# Patient Record
Sex: Female | Born: 1970
Health system: Southern US, Community
[De-identification: ages and names within clinical notes are randomized; demographics above are authoritative.]

## PROBLEM LIST (undated history)

## (undated) DIAGNOSIS — N840 Polyp of corpus uteri: Secondary | ICD-10-CM

## (undated) DIAGNOSIS — T7840XA Allergy, unspecified, initial encounter: Secondary | ICD-10-CM

## (undated) DIAGNOSIS — K219 Gastro-esophageal reflux disease without esophagitis: Secondary | ICD-10-CM

## (undated) DIAGNOSIS — D649 Anemia, unspecified: Secondary | ICD-10-CM

## (undated) HISTORY — DX: Gastro-esophageal reflux disease without esophagitis: K21.9

## (undated) HISTORY — DX: Polyp of corpus uteri: N84.0

## (undated) HISTORY — DX: Anemia, unspecified: D64.9

## (undated) HISTORY — PX: UPPER GASTROINTESTINAL ENDOSCOPY: SHX188

## (undated) HISTORY — PX: COLONOSCOPY: SHX174

## (undated) HISTORY — PX: TONSILLECTOMY: SUR1361

## (undated) HISTORY — DX: Allergy, unspecified, initial encounter: T78.40XA

## (undated) HISTORY — PX: DILATATION AND CURETTAGE/HYSTEROSCOPY WITH MINERVA: SHX6851

---

## 2009-07-22 ENCOUNTER — Emergency Department (HOSPITAL_COMMUNITY): Admission: EM | Admit: 2009-07-22 | Discharge: 2009-07-22 | Payer: Self-pay | Admitting: Emergency Medicine

## 2011-09-10 ENCOUNTER — Other Ambulatory Visit: Payer: Self-pay | Admitting: Obstetrics and Gynecology

## 2011-09-10 DIAGNOSIS — R928 Other abnormal and inconclusive findings on diagnostic imaging of breast: Secondary | ICD-10-CM

## 2011-09-17 ENCOUNTER — Ambulatory Visit
Admission: RE | Admit: 2011-09-17 | Discharge: 2011-09-17 | Disposition: A | Discharge status: Home / Self Care | Payer: Commercial Managed Care - PPO | Source: Ambulatory Visit | Attending: Obstetrics and Gynecology | Admitting: Obstetrics and Gynecology

## 2011-09-17 ENCOUNTER — Other Ambulatory Visit: Payer: Self-pay | Admitting: Obstetrics and Gynecology

## 2011-09-17 DIAGNOSIS — R928 Other abnormal and inconclusive findings on diagnostic imaging of breast: Secondary | ICD-10-CM

## 2014-03-14 ENCOUNTER — Other Ambulatory Visit: Payer: Self-pay | Admitting: Obstetrics and Gynecology

## 2014-03-14 DIAGNOSIS — N63 Unspecified lump in unspecified breast: Secondary | ICD-10-CM

## 2014-03-22 ENCOUNTER — Ambulatory Visit
Admission: RE | Admit: 2014-03-22 | Discharge: 2014-03-22 | Disposition: A | Discharge status: Home / Self Care | Payer: BC Managed Care – PPO | Source: Ambulatory Visit | Attending: Obstetrics and Gynecology | Admitting: Obstetrics and Gynecology

## 2014-03-22 ENCOUNTER — Encounter (INDEPENDENT_AMBULATORY_CARE_PROVIDER_SITE_OTHER): Payer: Self-pay

## 2014-03-22 DIAGNOSIS — N63 Unspecified lump in unspecified breast: Secondary | ICD-10-CM

## 2016-04-08 ENCOUNTER — Other Ambulatory Visit: Payer: Self-pay | Admitting: Obstetrics and Gynecology

## 2016-04-08 DIAGNOSIS — Z1231 Encounter for screening mammogram for malignant neoplasm of breast: Secondary | ICD-10-CM

## 2016-04-22 ENCOUNTER — Ambulatory Visit
Admission: RE | Admit: 2016-04-22 | Discharge: 2016-04-22 | Disposition: A | Discharge status: Home / Self Care | Payer: BC Managed Care – PPO | Source: Ambulatory Visit | Attending: Obstetrics and Gynecology | Admitting: Obstetrics and Gynecology

## 2016-04-22 DIAGNOSIS — Z1231 Encounter for screening mammogram for malignant neoplasm of breast: Secondary | ICD-10-CM

## 2018-05-16 ENCOUNTER — Other Ambulatory Visit: Payer: Self-pay | Admitting: Obstetrics and Gynecology

## 2018-05-16 DIAGNOSIS — Z1231 Encounter for screening mammogram for malignant neoplasm of breast: Secondary | ICD-10-CM

## 2018-06-09 ENCOUNTER — Ambulatory Visit
Admission: RE | Admit: 2018-06-09 | Discharge: 2018-06-09 | Disposition: A | Discharge status: Home / Self Care | Payer: BC Managed Care – PPO | Source: Ambulatory Visit | Attending: Obstetrics and Gynecology | Admitting: Obstetrics and Gynecology

## 2018-06-09 DIAGNOSIS — Z1231 Encounter for screening mammogram for malignant neoplasm of breast: Secondary | ICD-10-CM

## 2018-06-12 ENCOUNTER — Other Ambulatory Visit: Payer: Self-pay | Admitting: Obstetrics and Gynecology

## 2018-06-12 DIAGNOSIS — R928 Other abnormal and inconclusive findings on diagnostic imaging of breast: Secondary | ICD-10-CM

## 2018-06-14 ENCOUNTER — Ambulatory Visit
Admission: RE | Admit: 2018-06-14 | Discharge: 2018-06-14 | Disposition: A | Discharge status: Home / Self Care | Payer: BC Managed Care – PPO | Source: Ambulatory Visit | Attending: Obstetrics and Gynecology | Admitting: Obstetrics and Gynecology

## 2018-06-14 ENCOUNTER — Other Ambulatory Visit: Payer: Self-pay | Admitting: Obstetrics and Gynecology

## 2018-06-14 DIAGNOSIS — N6320 Unspecified lump in the left breast, unspecified quadrant: Secondary | ICD-10-CM

## 2018-06-14 DIAGNOSIS — R928 Other abnormal and inconclusive findings on diagnostic imaging of breast: Secondary | ICD-10-CM

## 2018-06-21 ENCOUNTER — Other Ambulatory Visit: Payer: BC Managed Care – PPO

## 2018-06-22 ENCOUNTER — Ambulatory Visit
Admission: RE | Admit: 2018-06-22 | Discharge: 2018-06-22 | Disposition: A | Discharge status: Home / Self Care | Payer: BC Managed Care – PPO | Source: Ambulatory Visit | Attending: Obstetrics and Gynecology | Admitting: Obstetrics and Gynecology

## 2018-06-22 ENCOUNTER — Other Ambulatory Visit: Payer: Self-pay | Admitting: Obstetrics and Gynecology

## 2018-06-22 DIAGNOSIS — N6320 Unspecified lump in the left breast, unspecified quadrant: Secondary | ICD-10-CM

## 2019-01-23 DIAGNOSIS — Z01419 Encounter for gynecological examination (general) (routine) without abnormal findings: Secondary | ICD-10-CM | POA: Diagnosis not present

## 2019-01-23 DIAGNOSIS — Z6831 Body mass index (BMI) 31.0-31.9, adult: Secondary | ICD-10-CM | POA: Diagnosis not present

## 2019-01-23 DIAGNOSIS — N939 Abnormal uterine and vaginal bleeding, unspecified: Secondary | ICD-10-CM | POA: Diagnosis not present

## 2019-06-21 ENCOUNTER — Other Ambulatory Visit: Payer: Self-pay | Admitting: Obstetrics and Gynecology

## 2019-06-21 DIAGNOSIS — Z1231 Encounter for screening mammogram for malignant neoplasm of breast: Secondary | ICD-10-CM

## 2019-08-10 ENCOUNTER — Other Ambulatory Visit: Payer: Self-pay

## 2019-08-10 ENCOUNTER — Ambulatory Visit
Admission: RE | Admit: 2019-08-10 | Discharge: 2019-08-10 | Disposition: A | Discharge status: Home / Self Care | Payer: 59 | Source: Ambulatory Visit | Attending: Obstetrics and Gynecology | Admitting: Obstetrics and Gynecology

## 2019-08-10 DIAGNOSIS — Z1231 Encounter for screening mammogram for malignant neoplasm of breast: Secondary | ICD-10-CM | POA: Diagnosis not present

## 2019-08-29 DIAGNOSIS — H5202 Hypermetropia, left eye: Secondary | ICD-10-CM | POA: Diagnosis not present

## 2019-08-29 DIAGNOSIS — H52222 Regular astigmatism, left eye: Secondary | ICD-10-CM | POA: Diagnosis not present

## 2019-08-29 DIAGNOSIS — H524 Presbyopia: Secondary | ICD-10-CM | POA: Diagnosis not present

## 2019-09-20 DIAGNOSIS — L821 Other seborrheic keratosis: Secondary | ICD-10-CM | POA: Diagnosis not present

## 2019-09-20 DIAGNOSIS — L812 Freckles: Secondary | ICD-10-CM | POA: Diagnosis not present

## 2019-09-20 DIAGNOSIS — D2262 Melanocytic nevi of left upper limb, including shoulder: Secondary | ICD-10-CM | POA: Diagnosis not present

## 2019-09-20 DIAGNOSIS — D2261 Melanocytic nevi of right upper limb, including shoulder: Secondary | ICD-10-CM | POA: Diagnosis not present

## 2019-09-20 DIAGNOSIS — D2272 Melanocytic nevi of left lower limb, including hip: Secondary | ICD-10-CM | POA: Diagnosis not present

## 2019-09-20 DIAGNOSIS — D225 Melanocytic nevi of trunk: Secondary | ICD-10-CM | POA: Diagnosis not present

## 2019-09-20 DIAGNOSIS — D2271 Melanocytic nevi of right lower limb, including hip: Secondary | ICD-10-CM | POA: Diagnosis not present

## 2019-09-20 DIAGNOSIS — D2372 Other benign neoplasm of skin of left lower limb, including hip: Secondary | ICD-10-CM | POA: Diagnosis not present

## 2019-09-20 DIAGNOSIS — D1801 Hemangioma of skin and subcutaneous tissue: Secondary | ICD-10-CM | POA: Diagnosis not present

## 2020-03-06 DIAGNOSIS — N912 Amenorrhea, unspecified: Secondary | ICD-10-CM | POA: Diagnosis not present

## 2020-03-06 DIAGNOSIS — Z6830 Body mass index (BMI) 30.0-30.9, adult: Secondary | ICD-10-CM | POA: Diagnosis not present

## 2020-03-06 DIAGNOSIS — Z01419 Encounter for gynecological examination (general) (routine) without abnormal findings: Secondary | ICD-10-CM | POA: Diagnosis not present

## 2020-03-06 DIAGNOSIS — N951 Menopausal and female climacteric states: Secondary | ICD-10-CM | POA: Diagnosis not present

## 2020-05-09 ENCOUNTER — Other Ambulatory Visit (HOSPITAL_COMMUNITY): Payer: Self-pay | Admitting: Obstetrics and Gynecology

## 2020-05-09 MED FILL — LO LOESTRIN FE 1-10 TABLET: 1 MG-10 MCG | 28 days supply | Qty: 28 | Fill #0

## 2020-05-15 IMAGING — MG MM CLIP PLACEMENT
2 series · 2 of 2 positions shown · non-contrast
Comparison: Previous exam(s).

CLINICAL DATA: Status post ultrasound-guided biopsy of a LEFT
breast mass at the 3 o'clock axis.

EXAM:
DIAGNOSTIC LEFT MAMMOGRAM POST ULTRASOUND BIOPSY

[L ML]
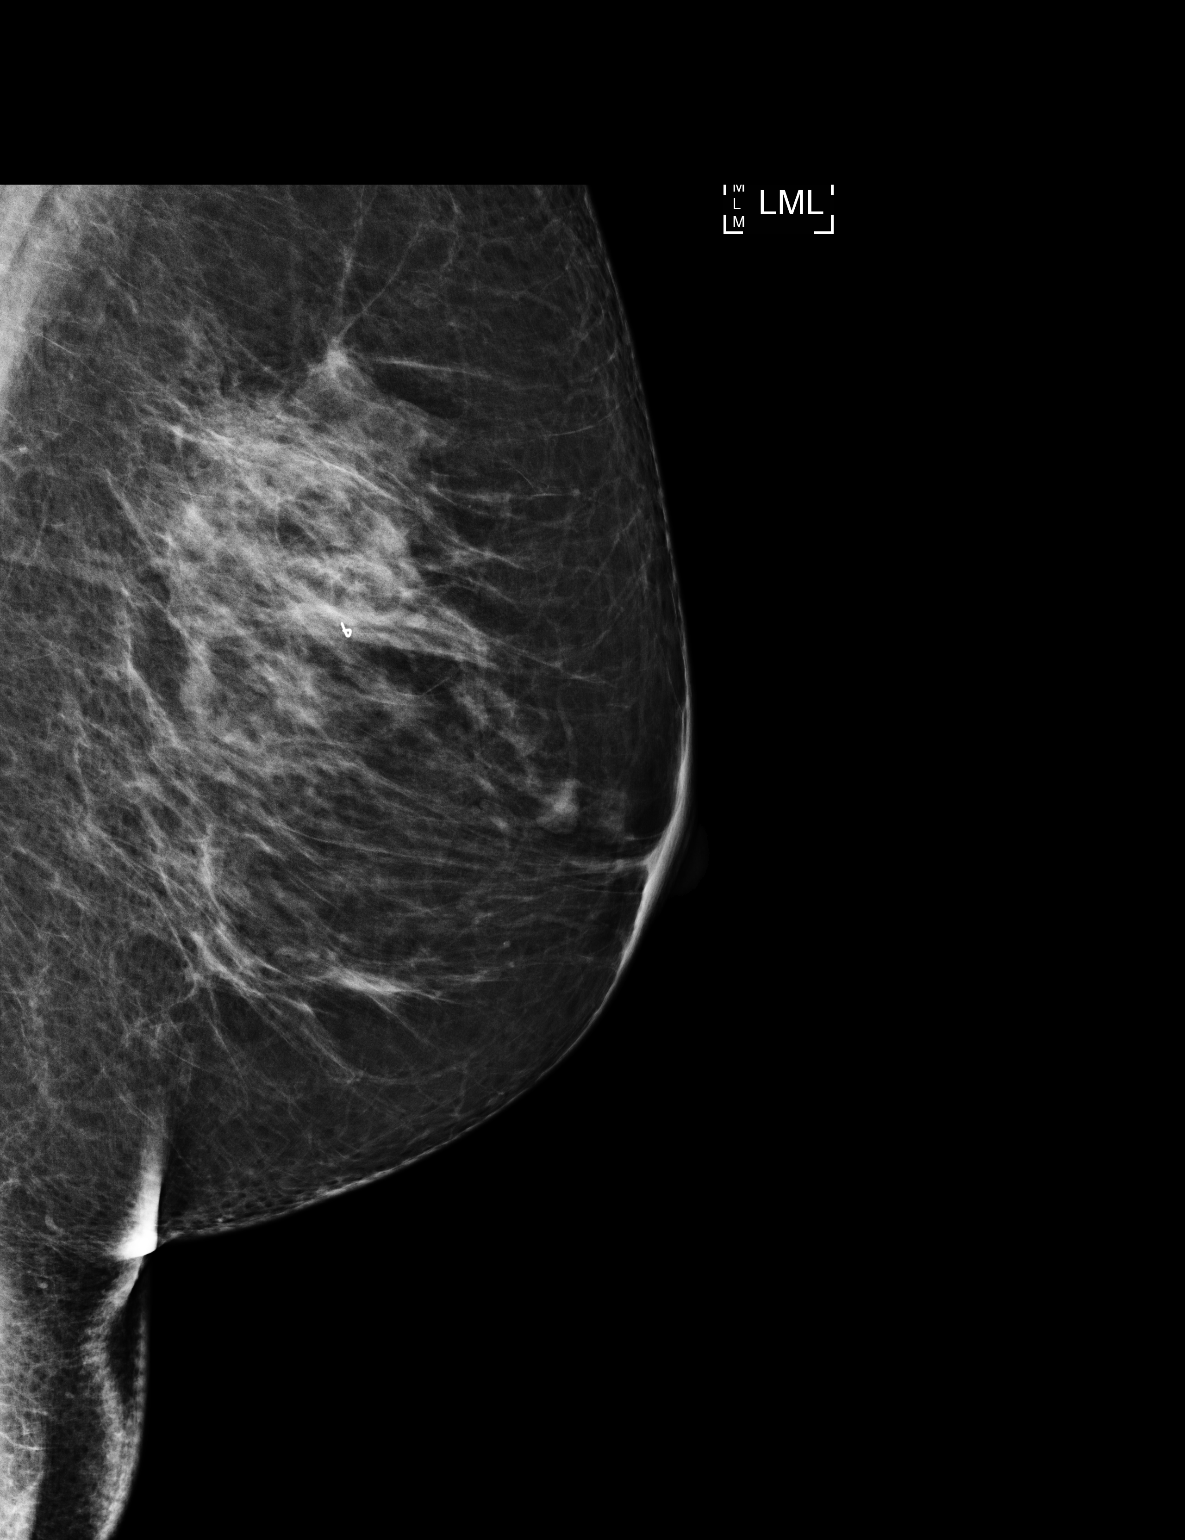

[L CC]
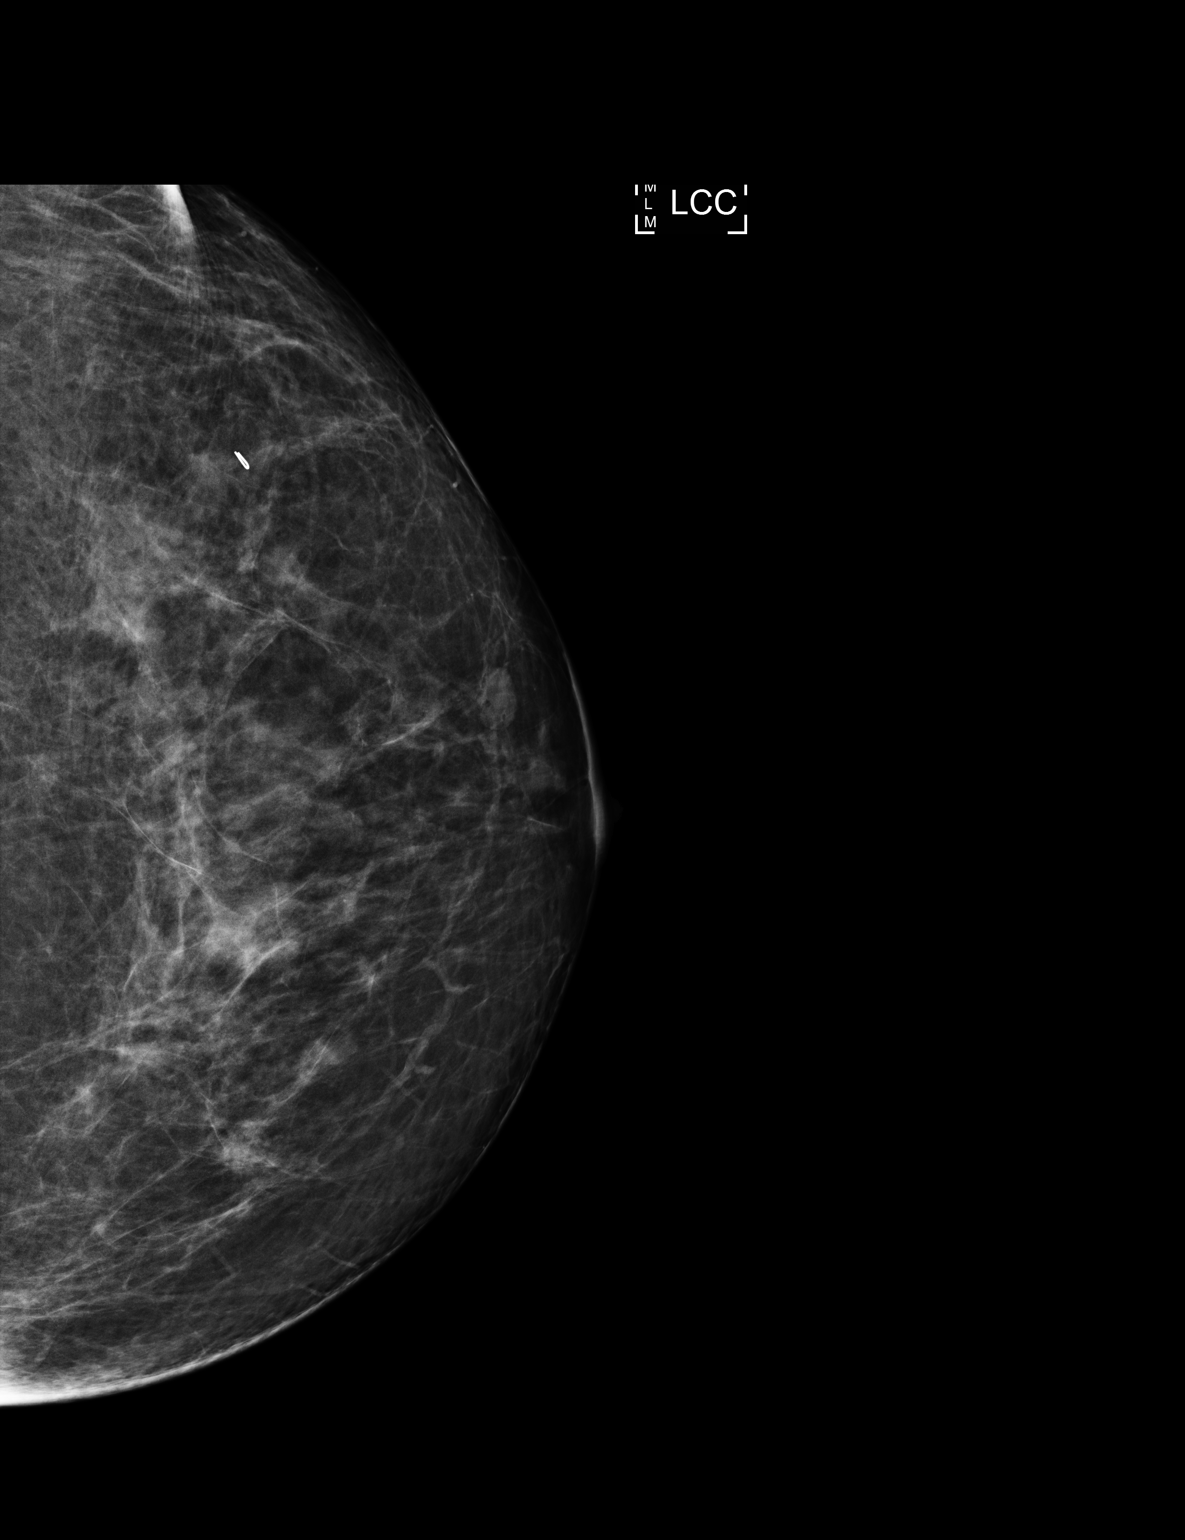

[2 of 2 positions shown; findings below may reference images not displayed]

FINDINGS: Mammographic images were obtained following ultrasound guided biopsy
of the LEFT breast mass at the 3 o'clock axis. A ribbon shaped clip
was placed at the conclusion of the procedure. Clip is
well-positioned at the site of the targeted mass.
IMPRESSION: Ribbon shaped clip is well-positioned at the site of the targeted
mass within the LEFT breast at the 3 o'clock axis.

Final Assessment: Post Procedure Mammograms for Marker Placement

## 2020-06-06 MED FILL — LO LOESTRIN FE 1-10 TABLET: 1 MG-10 MCG | 28 days supply | Qty: 28 | Fill #1

## 2020-07-07 MED FILL — LO LOESTRIN FE 1-10 TABLET: 1 MG-10 MCG | 28 days supply | Qty: 28 | Fill #2

## 2020-09-09 DIAGNOSIS — Z20828 Contact with and (suspected) exposure to other viral communicable diseases: Secondary | ICD-10-CM | POA: Diagnosis not present

## 2020-09-16 ENCOUNTER — Other Ambulatory Visit: Payer: 59

## 2020-09-16 DIAGNOSIS — Z9189 Other specified personal risk factors, not elsewhere classified: Secondary | ICD-10-CM | POA: Diagnosis not present

## 2020-09-18 DIAGNOSIS — Z1152 Encounter for screening for COVID-19: Secondary | ICD-10-CM | POA: Diagnosis not present

## 2020-09-25 DIAGNOSIS — D1801 Hemangioma of skin and subcutaneous tissue: Secondary | ICD-10-CM | POA: Diagnosis not present

## 2020-09-25 DIAGNOSIS — L738 Other specified follicular disorders: Secondary | ICD-10-CM | POA: Diagnosis not present

## 2020-09-25 DIAGNOSIS — D2261 Melanocytic nevi of right upper limb, including shoulder: Secondary | ICD-10-CM | POA: Diagnosis not present

## 2020-09-25 DIAGNOSIS — L821 Other seborrheic keratosis: Secondary | ICD-10-CM | POA: Diagnosis not present

## 2020-09-25 DIAGNOSIS — D2361 Other benign neoplasm of skin of right upper limb, including shoulder: Secondary | ICD-10-CM | POA: Diagnosis not present

## 2020-09-25 DIAGNOSIS — D235 Other benign neoplasm of skin of trunk: Secondary | ICD-10-CM | POA: Diagnosis not present

## 2020-09-25 DIAGNOSIS — D485 Neoplasm of uncertain behavior of skin: Secondary | ICD-10-CM | POA: Diagnosis not present

## 2020-09-25 DIAGNOSIS — D2262 Melanocytic nevi of left upper limb, including shoulder: Secondary | ICD-10-CM | POA: Diagnosis not present

## 2020-09-25 DIAGNOSIS — C44519 Basal cell carcinoma of skin of other part of trunk: Secondary | ICD-10-CM | POA: Diagnosis not present

## 2020-09-29 ENCOUNTER — Encounter: Payer: Self-pay | Admitting: Family Medicine

## 2020-09-29 ENCOUNTER — Encounter: Payer: Self-pay | Admitting: Physical Therapy

## 2020-09-29 ENCOUNTER — Ambulatory Visit (INDEPENDENT_AMBULATORY_CARE_PROVIDER_SITE_OTHER): Payer: 59 | Admitting: Family Medicine

## 2020-09-29 ENCOUNTER — Ambulatory Visit (INDEPENDENT_AMBULATORY_CARE_PROVIDER_SITE_OTHER): Payer: 59 | Admitting: Physical Therapy

## 2020-09-29 ENCOUNTER — Other Ambulatory Visit: Payer: Self-pay | Admitting: Family Medicine

## 2020-09-29 ENCOUNTER — Other Ambulatory Visit: Payer: Self-pay

## 2020-09-29 DIAGNOSIS — M545 Low back pain, unspecified: Secondary | ICD-10-CM | POA: Diagnosis not present

## 2020-09-29 DIAGNOSIS — M5442 Lumbago with sciatica, left side: Secondary | ICD-10-CM | POA: Diagnosis not present

## 2020-09-29 DIAGNOSIS — M6281 Muscle weakness (generalized): Secondary | ICD-10-CM

## 2020-09-29 DIAGNOSIS — R262 Difficulty in walking, not elsewhere classified: Secondary | ICD-10-CM | POA: Diagnosis not present

## 2020-09-29 MED ORDER — PREDNISONE 10 MG PO TABS: ORAL_TABLET | ORAL | 0 refills | Status: DC

## 2020-09-29 MED ORDER — BACLOFEN 10 MG PO TABS: 5.0000 mg | ORAL_TABLET | Freq: Three times a day (TID) | ORAL | 3 refills | Status: DC | PRN

## 2020-09-29 MED FILL — BACLOFEN 10 MG TABS: 10 | 10 days supply | Qty: 30 | Fill #0

## 2020-09-29 MED FILL — predniSONE 10 MG TABS: 10 | 12 days supply | Qty: 42 | Fill #0

## 2020-09-29 NOTE — Therapy (Signed)
[facility] Physical Therapy [address] [place], [place], [address] Phone: [phone]   Fax:  [phone]  Physical Therapy Evaluation  Patient Details  Name: Cathy Hickman MRN: [id number] Date of Birth: 1970-10-25 Referring Provider (PT): Eunice Blase, MD   Encounter Date: 09/29/2020   PT End of Session - 09/29/20 1544    Visit Number 1    Number of Visits 12    Date for PT Re-Evaluation 11/10/20    PT Start Time 1300    PT Stop Time 1345    PT Time Calculation (min) 45 min    Activity Tolerance Patient tolerated treatment well    Behavior During Therapy [facility] for tasks assessed/performed           History reviewed. No pertinent past medical history.  History reviewed. No pertinent surgical history.  There were no vitals filed for this visit.    Subjective Assessment - 09/29/20 1308    Subjective Olukemi arrived today for PT evaluation reporting onset of low back pain about 3-4 weeks. Pt stating that she has had flare ups in the past, but this seems worse. Pt reporting pain is worse in the morning.  Pt stating pain gets better throughout the day due to spasms. Pt reporting pain raidating into her Left hip and buttock.    Pertinent History unremarkable    Limitations Standing;Walking;House hold activities;Other (comment)    How long can you walk comfortably? "walking is painful"    Patient Stated Goals Walk without hurting, PLOF    Currently in Pain? Yes    Pain Score 4     Pain Location Back    Pain Orientation Left;Lower;Mid    Pain Descriptors / Indicators Stabbing    Pain Type Acute pain    Pain Radiating Towards Left buttock    Pain Onset 1 to 4 weeks ago    Pain Frequency Constant    Aggravating Factors  when first wakes up, bending    Pain Relieving Factors heat    Effect of Pain on Daily Activities difficulty with ADL's and working              [facility] PT Assessment - 09/29/20 0001      Assessment   Medical Diagnosis M54.50 acute L  sided LBP without sciatica    Referring Provider (PT) Hilts, Michael, MD    Prior Therapy no      Precautions   Precautions None      Restrictions   Weight Bearing Restrictions No      Balance Screen   Has the patient fallen in the past 6 months No    Is the patient reluctant to leave their home because of a fear of falling?  No      Home Ecologist residence      Prior Function   Level of Independence Independent    Vocation Full time employment    Vocation Requirements works at [employer] Status Within Functional Limits for tasks assessed      Observation/Other Assessments   Focus on Therapeutic Outcomes (FOTO)  43% (predicted 71%)      Posture/Postural Control   Posture/Postural Control Postural limitations    Postural Limitations Rounded Shoulders;Forward head      ROM / Strength   AROM / PROM / Strength AROM;Strength      AROM   Overall AROM  Deficits    AROM Assessment Site Lumbar  Lumbar Flexion 10    Lumbar Extension 20    Lumbar - Right Side Bend 30    Lumbar - Left Side Bend 30    Lumbar - Right Rotation 25% limited    Lumbar - Left Rotation 50% limitation      Strength   Strength Assessment Site Hip    Right/Left Hip Right;Left    Right Hip Flexion 4/5    Right Hip Extension 4/5    Right Hip ABduction 4/5    Right Hip ADduction 4/5    Left Hip Flexion 3+/5    Left Hip Extension 3+/5    Left Hip ABduction 3+/5    Left Hip ADduction 3+/5      Flexibility   Soft Tissue Assessment /Muscle Length yes    Hamstrings R: 80 degrees, L: 62 degrees with pain      Palpation   Palpation comment TTP: bilateral lumbar paraspinald, lower thoracic paraspinas, L QL and L piriformis      Special Tests    Special Tests Lumbar    Other special tests SLR: negative on Left      Ambulation/Gait   Assistive device None    Gait Pattern Step-through pattern;Decreased step length - left;Decreased  stance time - left;Decreased stride length;Antalgic                      Objective measurements completed on examination: See above findings.       [place] Adult PT Treatment/Exercise - 09/29/20 0001      Modalities   Modalities Moist Heat      Moist Heat Therapy   Number Minutes Moist Heat 15 Minutes   during evaluation process   Moist Heat Location Lumbar Spine      Manual Therapy   Manual Therapy Soft tissue mobilization    Manual therapy comments percusssion to lumbar paraspinals, L piriformis   10 minutes                 PT Education - 09/29/20 1543    Education Details PT POC, HEP    Person(s) Educated Patient    Methods Explanation;Demonstration;Handout;Verbal cues    Comprehension Verbalized understanding;Returned demonstration            PT Short Term Goals - 09/29/20 1615      PT SHORT TERM GOAL #1   Title Pt will be independent in her initial  HEP .    Time 3    Period Weeks    Status New    Target Date 10/27/20             PT Long Term Goals - 09/29/20 1616      PT LONG TERM GOAL #1   Title Pt will improve her FOTO from 43% function to >/= 71 % function.    Time 6    Period Weeks    Status New    Target Date 11/07/20      PT LONG TERM GOAL #2   Title Pt will improve her bilateral LE strength to grossly +4/5 to improve functional mobilty.    Time 6    Period Weeks    Status New    Target Date 11/07/20      PT LONG TERM GOAL #3   Title Pt will be able to report pain </= 2/10 after working a full shift.    Time 6    Period Weeks    Target Date 11/07/20  PT LONG TERM GOAL #4   Title Pt will be able to improve her lumbar flexion to >/= 60 degrees to improve functional mobilty and ADL's.    Baseline see flow sheets.    Time 6    Period Weeks    Status New    Target Date 11/07/20                  Plan - 09/29/20 1619    Clinical Impression Statement Pt arriving for PT evaluation presenting with 3-4  week onset of low back pain which is worsening. Pt reporting pain is worse in the mornings and at end of day. Pt feels movement helps along with heat. Pt presenting with weakness of 3+/5 in her Left LE, R LE grossly 4/5 limited by pain in low back with both LE MMT. Pt tender to palpation in her lumbar paraspinals, lower throacic paraspinals, and L piriformis. Pt with negative L SLR, L sacral compression.  Pt with good response to extension exercises and percussion. Skilled PT needed to address pt's impairments with the below interventions.    Examination-Activity Limitations Stand;Other;Lift;Dressing;Squat;Stairs    Examination-Participation Restrictions Community Activity;Other;Occupation    Stability/Clinical Decision Making Stable/Uncomplicated    Clinical Decision Making Low    Rehab Potential Excellent    PT Frequency 2x / week    PT Duration 6 weeks    PT Treatment/Interventions Cryotherapy;Electrical Stimulation;Iontophoresis 4mg /ml Dexamethasone;Moist Heat;Traction;Ultrasound;Gait training;Stair training;Functional mobility training;Therapeutic activities;Patient/family education;Neuromuscular re-education;Balance training;Therapeutic exercise;Manual techniques;Passive range of motion;Dry needling;Taping    PT Next Visit Plan Extension progression, core strentheing, spinal mobs, hip flexor stretching,hamstring stretching, LE strengthening, modalities as needed for pain.    PT Home Exercise Plan Access Code: [email or web address]    Consulted and Agree with Plan of Care Patient           Patient will benefit from skilled therapeutic intervention in order to improve the following deficits and impairments:  Pain,Postural dysfunction,Decreased endurance,Decreased strength,Decreased activity tolerance,Difficulty walking,Impaired flexibility  Visit Diagnosis: Acute midline low back pain with left-sided sciatica  Difficulty in walking, not elsewhere classified  Muscle weakness  (generalized)     Problem List There are no problems to display for this patient.   Oretha Caprice, PT, MPT 09/29/2020, 4:28 PM  [facility] Physical Therapy [address] [place], [place], [address] Phone: [phone]   Fax:  [phone]  Name: Kingston Guiles MRN: [id number] Date of Birth: [birth year]

## 2020-09-29 NOTE — Progress Notes (Signed)
   Office Visit Note   [name]           Date of Birth: 1971-06-07           MRN: [id number] Visit Date: 09/29/2020 Requested by: Louretta Shorten, MD [address] [place],  [place] [address] PCP: Louretta Shorten, MD  Subjective: Chief Complaint  Patient presents with  . Lower Back - Pain    Pain in the middle of the lower back, progressively worsening over the past 2 weeks. NKI. Hurts worse first a.m. Pain goes from the middle of the back to the left side of her hip and into the buttock. Starting to wrap around the hip to anterior aspect. No radiating pain down the leg. The left leg feels weak. Hurts to lift the left leg. No pain with lying down. Spasms in the left flank and down into the left buttock.    HPI: She is here with left-sided low back pain.  For a while now she has had intermittent soreness in the left low back/posterior hip when she wakes up in the morning, and she has had a few episodes where she was bending down to get something and sneezed and developed more severe pain temporarily.  In the past 2 weeks without injury, her symptoms have become constant and progressively worsening.  It is very painful when she first wakes up in the morning, difficult to get out of bed.  The pain gets better as the day goes on but it never goes away completely.  She has not had any sciatica symptoms.  She has tried various stretches with no notable improvement.  Denies bowel or bladder dysfunction, denies any rash, fevers, chills.  Pain is best when standing up but bending slightly forward.                ROS:   All other systems were reviewed and are negative.  Objective: Vital Signs: There were no vitals taken for this visit.  Physical Exam:  General:  Alert and oriented, in no acute distress. Pulm:  Breathing unlabored. Psy:  Normal mood, congruent affect. Skin: No visible rash Low back: She has tenderness primarily in the paraspinous muscle to the left of L4-5.  No  pain over the SI joint or in the sciatic notch.  Negative straight leg raise, lower extremity strength and reflexes are normal.  Imaging: No results found.  Assessment & Plan: 1.  Chronic intermittent low back pain with acute flareup, possibly myofascial but cannot rule out lumbar disc protrusion. -We will try, prednisone and physical therapy.  If symptoms persist, then x-rays and MRI scan.     Procedures: No procedures performed        PMFS History: There are no problems to display for this patient.  History reviewed. No pertinent past medical history.  History reviewed. No pertinent family history.  History reviewed. No pertinent surgical history. Social History   Occupational History  . Not on file  Tobacco Use  . Smoking status: Not on file  . Smokeless tobacco: Not on file  Substance and Sexual Activity  . Alcohol use: Not on file  . Drug use: Not on file  . Sexual activity: Not on file

## 2020-09-29 NOTE — Patient Instructions (Signed)
Access Code: [facility] URL: [email or web address] Date: 09/29/2020 Prepared by: Kearney Hard  Exercises Prone Press Up on Elbows - 2-3 x daily - 7 x weekly - 3-5 reps - 30 seconds hold Supine Bridge - 2-3 x daily - 7 x weekly - 2 sets - 10 reps Supine Piriformis Stretch with Foot on Ground - 2-3 x daily - 7 x weekly - 5 reps - 30 seconds hold Standing Lumbar Extension at Wall - Forearms - 2-3 x daily - 7 x weekly - 10 seconds hold

## 2020-10-02 ENCOUNTER — Other Ambulatory Visit: Payer: Self-pay

## 2020-10-02 ENCOUNTER — Ambulatory Visit (INDEPENDENT_AMBULATORY_CARE_PROVIDER_SITE_OTHER): Payer: 59 | Admitting: Physical Therapy

## 2020-10-02 DIAGNOSIS — M6281 Muscle weakness (generalized): Secondary | ICD-10-CM

## 2020-10-02 DIAGNOSIS — R262 Difficulty in walking, not elsewhere classified: Secondary | ICD-10-CM | POA: Diagnosis not present

## 2020-10-02 DIAGNOSIS — M5442 Lumbago with sciatica, left side: Secondary | ICD-10-CM | POA: Diagnosis not present

## 2020-10-02 NOTE — Therapy (Signed)
[facility] Physical Therapy [address] [place], [place], [address] Phone: [phone]   Fax:  [phone]  Physical Therapy Treatment  Patient Details  Name: Cathy Hickman MRN: [id number] Date of Birth: 1971-02-01 Referring Provider (PT): Eunice Blase, MD   Encounter Date: 10/02/2020   PT End of Session - 10/02/20 1451    Visit Number 2    Number of Visits 12    Date for PT Re-Evaluation 11/10/20    PT Start Time 1300    PT Stop Time 1346    PT Time Calculation (min) 46 min    Activity Tolerance Patient tolerated treatment well    Behavior During Therapy [facility] for tasks assessed/performed           No past medical history on file.  No past surgical history on file.  There were no vitals filed for this visit.   Subjective Assessment - 10/02/20 1356    Subjective She states yesterday was a good day for her back but this morning she woke up with a lot of pain but this has eased off to about a 4 out of 10 now after moving around and stretching some. She feels like the mckenzie exercises are helpful.    Pertinent History unremarkable    Limitations Standing;Walking;House hold activities;Other (comment)    How long can you walk comfortably? "walking is painful"    Patient Stated Goals Walk without hurting, PLOF    Pain Onset 1 to 4 weeks ago            [facility] Adult PT Treatment/Exercise - 10/02/20 0001      Exercises   Exercises Lumbar      Lumbar Exercises: Stretches   Lower Trunk Rotation 5 reps;10 seconds    Piriformis Stretch Left;3 reps;30 seconds    Other Lumbar Stretch Exercise prone lying 3 min, then prone on elbows 3 min      Lumbar Exercises: Supine   Bridge 10 reps;5 seconds   2 sets     Lumbar Exercises: Quadruped   Madcat/Old Horse 10 reps    Other Quadruped Lumbar Exercises quadriped hip drop(reverse prone press up)  5sec hold X 10 reps      Modalities   Modalities Moist Heat      Moist Heat Therapy   Number Minutes Moist  Heat 8 Minutes    Moist Heat Location Lumbar Spine      Manual Therapy   Manual therapy comments STM with biofreze, lumbar central PA mobs grade 3-4, SL lumbar thoracic rotation mobs grade 3-5, long axis distraction performed unilat and bilat.                    PT Short Term Goals - 09/29/20 1615      PT SHORT TERM GOAL #1   Title Pt will be independent in her initial  HEP .    Time 3    Period Weeks    Status New    Target Date 10/27/20             PT Long Term Goals - 09/29/20 1616      PT LONG TERM GOAL #1   Title Pt will improve her FOTO from 43% function to >/= 71 % function.    Time 6    Period Weeks    Status New    Target Date 11/07/20      PT LONG TERM GOAL #2   Title Pt will improve her bilateral LE  strength to grossly +4/5 to improve functional mobilty.    Time 6    Period Weeks    Status New    Target Date 11/07/20      PT LONG TERM GOAL #3   Title Pt will be able to report pain </= 2/10 after working a full shift.    Time 6    Period Weeks    Target Date 11/07/20      PT LONG TERM GOAL #4   Title Pt will be able to improve her lumbar flexion to >/= 60 degrees to improve functional mobilty and ADL's.    Baseline see flow sheets.    Time 6    Period Weeks    Status New    Target Date 11/07/20                 Plan - 10/02/20 1452    Clinical Impression Statement Session focused on manual therapy and extension based mobility/stretching program for her low back. She appears to be making some early progress with HEP and extension based program but continues to be limited with pain, and spasms paticularly first thing in the morning. Continue Plan of care.    Examination-Activity Limitations Stand;Other;Lift;Dressing;Squat;Stairs    Examination-Participation Restrictions Community Activity;Other;Occupation    Stability/Clinical Decision Making Stable/Uncomplicated    Rehab Potential Excellent    PT Frequency 2x / week    PT Duration  6 weeks    PT Treatment/Interventions Cryotherapy;Electrical Stimulation;Iontophoresis 4mg /ml Dexamethasone;Moist Heat;Traction;Ultrasound;Gait training;Stair training;Functional mobility training;Therapeutic activities;Patient/family education;Neuromuscular re-education;Balance training;Therapeutic exercise;Manual techniques;Passive range of motion;Dry needling;Taping    PT Next Visit Plan Extension progression, core strentheing, spinal mobs, hip flexor stretching,hamstring stretching, LE strengthening, modalities as needed for pain.    PT Home Exercise Plan Access Code: [email or web address]    Consulted and Agree with Plan of Care Patient           Patient will benefit from skilled therapeutic intervention in order to improve the following deficits and impairments:  Pain,Postural dysfunction,Decreased endurance,Decreased strength,Decreased activity tolerance,Difficulty walking,Impaired flexibility  Visit Diagnosis: Acute midline low back pain with left-sided sciatica  Difficulty in walking, not elsewhere classified  Muscle weakness (generalized)     Problem List There are no problems to display for this patient.   Debbe Odea, PT,DPT 10/02/2020, 2:54 PM  [facility] Physical Therapy [address] [place], [place], [address] Phone: [phone]   Fax:  [phone]  Name: Cathy Hickman MRN: [id number] Date of Birth: December 11, 1970

## 2020-10-06 ENCOUNTER — Ambulatory Visit (INDEPENDENT_AMBULATORY_CARE_PROVIDER_SITE_OTHER): Payer: 59 | Admitting: Physical Therapy

## 2020-10-06 ENCOUNTER — Other Ambulatory Visit: Payer: Self-pay

## 2020-10-06 DIAGNOSIS — M5442 Lumbago with sciatica, left side: Secondary | ICD-10-CM

## 2020-10-06 DIAGNOSIS — M6281 Muscle weakness (generalized): Secondary | ICD-10-CM | POA: Diagnosis not present

## 2020-10-06 DIAGNOSIS — R262 Difficulty in walking, not elsewhere classified: Secondary | ICD-10-CM | POA: Diagnosis not present

## 2020-10-06 NOTE — Therapy (Signed)
[facility] Physical Therapy [address] [place], [place], [address] Phone: [phone]   Fax:  [phone]  Physical Therapy Treatment  Patient Details  Name: Cathy Hickman MRN: [id number] Date of Birth: 1971-08-11 Referring Provider (PT): Eunice Blase, MD   Encounter Date: 10/06/2020   PT End of Session - 10/06/20 1155    Visit Number 3    Number of Visits 12    Date for PT Re-Evaluation 11/10/20    PT Start Time 0845    PT Stop Time 0931    PT Time Calculation (min) 46 min    Activity Tolerance Patient tolerated treatment well    Behavior During Therapy [facility] for tasks assessed/performed           No past medical history on file.  No past surgical history on file.  There were no vitals filed for this visit.   Subjective Assessment - 10/06/20 0857    Subjective She states she had massage this weekend which made her back feel good at the time then her back locked back up. She continues to have more pain in the mornings and relays about 5/10 pain upon arrival in the center with some pain into her buttock             [facility] Adult PT Treatment/Exercise - 10/06/20 0001      Lumbar Exercises: Stretches   Single Knee to Chest Stretch Right;Left;2 reps;20 seconds    Lower Trunk Rotation 5 reps;10 seconds    Pelvic Tilt 10 reps    Piriformis Stretch Left;3 reps;30 seconds;Right    Other Lumbar Stretch Exercise child pose 30 sec X 3      Lumbar Exercises: Supine   Bridge 10 reps;5 seconds      Lumbar Exercises: Quadruped   Madcat/Old Horse 10 reps      Modalities   Modalities Moist Heat;Traction      Moist Heat Therapy   Number Minutes Moist Heat 15 Minutes   while on traction   Moist Heat Location Lumbar Spine      Traction   Type of Traction Lumbar    Min (lbs) 70    Max (lbs) 85    Hold Time 60    Rest Time 20    Time 15                    PT Short Term Goals - 09/29/20 1615      PT SHORT TERM GOAL #1   Title Pt will be  independent in her initial  HEP .    Time 3    Period Weeks    Status New    Target Date 10/27/20             PT Long Term Goals - 09/29/20 1616      PT LONG TERM GOAL #1   Title Pt will improve her FOTO from 43% function to >/= 71 % function.    Time 6    Period Weeks    Status New    Target Date 11/07/20      PT LONG TERM GOAL #2   Title Pt will improve her bilateral LE strength to grossly +4/5 to improve functional mobilty.    Time 6    Period Weeks    Status New    Target Date 11/07/20      PT LONG TERM GOAL #3   Title Pt will be able to report pain </= 2/10 after working a  full shift.    Time 6    Period Weeks    Target Date 11/07/20      PT LONG TERM GOAL #4   Title Pt will be able to improve her lumbar flexion to >/= 60 degrees to improve functional mobilty and ADL's.    Baseline see flow sheets.    Time 6    Period Weeks    Status New    Target Date 11/07/20                 Plan - 10/06/20 1156    Clinical Impression Statement Attempted mechanical traction today but she is unsure if this helped o if she liked this. She did express she felt like the "blood was rushing to her head, and felt claustrophobic with this". Then focused on lumbar mobility exercises and she acturally preferred flexion based stretching today vs extension based.    Examination-Activity Limitations Stand;Other;Lift;Dressing;Squat;Stairs    Examination-Participation Restrictions Community Activity;Other;Occupation    Stability/Clinical Decision Making Stable/Uncomplicated    Rehab Potential Excellent    PT Frequency 2x / week    PT Duration 6 weeks    PT Treatment/Interventions Cryotherapy;Electrical Stimulation;Iontophoresis 4mg /ml Dexamethasone;Moist Heat;Traction;Ultrasound;Gait training;Stair training;Functional mobility training;Therapeutic activities;Patient/family education;Neuromuscular re-education;Balance training;Therapeutic exercise;Manual techniques;Passive range of  motion;Dry needling;Taping    PT Next Visit Plan how was traction? assess for flexion and correciton for anterior pelvic tilt vs extension as she perferred felxion last time.  core strentheing, spinal mobs, hip flexor stretching,hamstring stretching, LE strengthening, modalities as needed for pain.    PT Home Exercise Plan Access Code: [email or web address]    Consulted and Agree with Plan of Care Patient           Patient will benefit from skilled therapeutic intervention in order to improve the following deficits and impairments:  Pain,Postural dysfunction,Decreased endurance,Decreased strength,Decreased activity tolerance,Difficulty walking,Impaired flexibility  Visit Diagnosis: Acute midline low back pain with left-sided sciatica  Difficulty in walking, not elsewhere classified  Muscle weakness (generalized)     Problem List There are no problems to display for this patient.   Silvestre Mesi 10/06/2020, 1:09 PM  [facility] Physical Therapy [address] [place], [place], [address] Phone: [phone]   Fax:  [phone]  Name: Cathy Hickman MRN: [id number] Date of Birth: [birth year]

## 2020-10-08 ENCOUNTER — Encounter: Payer: Self-pay | Admitting: Physical Therapy

## 2020-10-08 ENCOUNTER — Other Ambulatory Visit: Payer: Self-pay

## 2020-10-08 ENCOUNTER — Ambulatory Visit (INDEPENDENT_AMBULATORY_CARE_PROVIDER_SITE_OTHER): Payer: 59 | Admitting: Physical Therapy

## 2020-10-08 DIAGNOSIS — R262 Difficulty in walking, not elsewhere classified: Secondary | ICD-10-CM | POA: Diagnosis not present

## 2020-10-08 DIAGNOSIS — M5442 Lumbago with sciatica, left side: Secondary | ICD-10-CM

## 2020-10-08 DIAGNOSIS — M6281 Muscle weakness (generalized): Secondary | ICD-10-CM | POA: Diagnosis not present

## 2020-10-08 NOTE — Therapy (Signed)
[facility] Physical Therapy [address] [place], [place], [address] Phone: [phone]   Fax:  [phone]  Physical Therapy Treatment  Patient Details  Name: Icelynn Onken MRN: [id number] Date of Birth: 1971-04-26 Referring Provider (PT): Eunice Blase, MD   Encounter Date: 10/08/2020   PT End of Session - 10/08/20 1047    Visit Number 4    Number of Visits 12    Date for PT Re-Evaluation 11/10/20    PT Start Time 0845    PT Stop Time 0915    PT Time Calculation (min) 30 min    Activity Tolerance Patient tolerated treatment well    Behavior During Therapy [facility] for tasks assessed/performed           History reviewed. No pertinent past medical history.  History reviewed. No pertinent surgical history.  There were no vitals filed for this visit.   Subjective Assessment - 10/08/20 1008    Subjective Pt arriving to therapy reporting improvements since trying the traction which may also been a combination of her not taking her prednisone. Pt reporting she felt a liitle clastrophobic during the traction but she is willing to try it again with the chest strap a little loser.    Pertinent History unremarkable    Limitations Standing;Walking;House hold activities;Other (comment)    How long can you walk comfortably? "walking is painful"    Patient Stated Goals Walk without hurting, PLOF    Currently in Pain? Yes    Pain Score 4     Pain Location Back    Pain Orientation Left;Lower    Pain Descriptors / Indicators Sore;Aching    Pain Type Acute pain    Pain Onset 1 to 4 weeks ago                             [facility] Adult PT Treatment/Exercise - 10/08/20 0001      Modalities   Modalities Traction      Moist Heat Therapy   Number Minutes Moist Heat 25 Minutes    Moist Heat Location Lumbar Spine      Traction   Type of Traction Lumbar    Min (lbs) 50    Max (lbs) 75    Hold Time 60    Rest Time 20    Time 20                     PT Short Term Goals - 10/08/20 1053      PT SHORT TERM GOAL #1   Title Pt will be independent in her initial  HEP .    Status On-going             PT Long Term Goals - 10/08/20 1053      PT LONG TERM GOAL #1   Title Pt will improve her FOTO from 43% function to >/= 71 % function.    Status On-going      PT LONG TERM GOAL #2   Title Pt will improve her bilateral LE strength to grossly +4/5 to improve functional mobilty.    Status On-going      PT LONG TERM GOAL #3   Title Pt will be able to report pain </= 2/10 after working a full shift.    Status On-going      PT LONG TERM GOAL #4   Title Pt will be able to improve her lumbar flexion to >/= 60  degrees to improve functional mobilty and ADL's.    Status On-going                 Plan - 10/08/20 1048    Clinical Impression Statement Pt arriving to therapy reporting feeling a little better following last treatment session. Pt reported that she didn''t tolerate the traction well at her last visit. She stated she felt clastrophobic but pt was willing to try again with chest strap loosened. Pt was edu on physical therpay plan to try traction today with no change in her HEP to see if it is helping with her pain. Pt placed on mechanical traction with pull reducted to 75 pounds max and 50 pound minimal pull for 25 minutes total treatment. Pt's HEP and nutrition/diet were discussed. Pt reporting better tolerance today to traction at end of session. A moist heat pack was issued for pt to use while working since pt works as a Marine scientist in our clinic. Next visit follow up on traction outcome. Conitnue skilled PT as pt tolerates.    Examination-Activity Limitations Stand;Other;Lift;Dressing;Squat;Stairs    Examination-Participation Restrictions Community Activity;Other;Occupation    Stability/Clinical Decision Making Stable/Uncomplicated    Rehab Potential Excellent    PT Frequency 2x / week    PT Duration 6 weeks     PT Treatment/Interventions Cryotherapy;Electrical Stimulation;Iontophoresis 4mg /ml Dexamethasone;Moist Heat;Traction;Ultrasound;Gait training;Stair training;Functional mobility training;Therapeutic activities;Patient/family education;Neuromuscular re-education;Balance training;Therapeutic exercise;Manual techniques;Passive range of motion;Dry needling;Taping    PT Next Visit Plan how was traction? assess for flexion and correciton for anterior pelvic tilt vs extension as she perferred felxion last time.  core strentheing, spinal mobs, hip flexor stretching,hamstring stretching, LE strengthening, modalities as needed for pain.    PT Home Exercise Plan Access Code: [email or web address]    Consulted and Agree with Plan of Care Patient           Patient will benefit from skilled therapeutic intervention in order to improve the following deficits and impairments:  Pain,Postural dysfunction,Decreased endurance,Decreased strength,Decreased activity tolerance,Difficulty walking,Impaired flexibility  Visit Diagnosis: Acute midline low back pain with left-sided sciatica  Muscle weakness (generalized)  Difficulty in walking, not elsewhere classified     Problem List There are no problems to display for this patient.   Oretha Caprice, PT, MPT 10/08/2020, 10:54 AM  [facility] Physical Therapy [address] [place], [place], [address] Phone: [phone]   Fax:  [phone]  Name: Jeremie Abdelaziz MRN: [id number] Date of Birth: [birth year]

## 2020-10-09 DIAGNOSIS — N950 Postmenopausal bleeding: Secondary | ICD-10-CM | POA: Diagnosis not present

## 2020-10-17 ENCOUNTER — Other Ambulatory Visit: Payer: Self-pay | Admitting: Obstetrics and Gynecology

## 2020-10-17 DIAGNOSIS — Z1231 Encounter for screening mammogram for malignant neoplasm of breast: Secondary | ICD-10-CM

## 2020-10-20 ENCOUNTER — Other Ambulatory Visit: Payer: Self-pay

## 2020-10-20 ENCOUNTER — Encounter: Payer: Self-pay | Admitting: Physical Therapy

## 2020-10-20 ENCOUNTER — Ambulatory Visit (INDEPENDENT_AMBULATORY_CARE_PROVIDER_SITE_OTHER): Payer: 59 | Admitting: Physical Therapy

## 2020-10-20 DIAGNOSIS — M6281 Muscle weakness (generalized): Secondary | ICD-10-CM | POA: Diagnosis not present

## 2020-10-20 DIAGNOSIS — M5442 Lumbago with sciatica, left side: Secondary | ICD-10-CM | POA: Diagnosis not present

## 2020-10-20 DIAGNOSIS — R262 Difficulty in walking, not elsewhere classified: Secondary | ICD-10-CM

## 2020-10-20 NOTE — Therapy (Addendum)
[facility] Physical Therapy [address] [place], [place], [address] Phone: [phone]   Fax:  [phone]  Physical Therapy Treatment Discharge  Patient Details  Name: Cathy Hickman MRN: [id number] Date of Birth: 09-01-1971 Referring Provider (PT): Eunice Blase, MD   Encounter Date: 10/20/2020   PT End of Session - 10/20/20 0906    Visit Number 5    Number of Visits 12    Date for PT Re-Evaluation 11/10/20    PT Start Time 0846    PT Stop Time 0928    PT Time Calculation (min) 42 min    Activity Tolerance Patient tolerated treatment well    Behavior During Therapy [facility] for tasks assessed/performed           History reviewed. No pertinent past medical history.  History reviewed. No pertinent surgical history.  There were no vitals filed for this visit.   Subjective Assessment - 10/20/20 0854    Subjective Pt arriving to therpay reporting 2/10 pain in her low back with increased pain on her left side. Pt reporting she will be having a procedure on Wednesday and will not be at therapy the rest of the week.    Pertinent History unremarkable    Limitations Standing;Walking;House hold activities;Other (comment)    How long can you walk comfortably? "walking is painful"    Patient Stated Goals Walk without hurting, PLOF    Currently in Pain? Yes    Pain Score 2     Pain Location Back    Pain Orientation Lower;Left    Pain Descriptors / Indicators Aching;Sore    Pain Type Acute pain    Pain Onset 1 to 4 weeks ago                             [facility] Adult PT Treatment/Exercise - 10/20/20 0001      Lumbar Exercises: Stretches   ITB Stretch Left;Right;2 reps;30 seconds    Piriformis Stretch Right;Left;3 reps;30 seconds      Lumbar Exercises: Supine   Bridge Compliant;15 reps;3 seconds    Other Supine Lumbar Exercises PPT x 10 holding 5 seconds      Modalities   Modalities Traction      Moist Heat Therapy   Number Minutes Moist  Heat 20 Minutes    Moist Heat Location Lumbar Spine      Traction   Type of Traction Lumbar    Min (lbs) 60    Max (lbs) 80    Hold Time 60    Rest Time 20    Time 20                    PT Short Term Goals - 10/20/20 7106      PT SHORT TERM GOAL #1   Title Pt will be independent in her initial  HEP .    Status Achieved             PT Long Term Goals - 10/20/20 2694      PT LONG TERM GOAL #1   Title Pt will improve her FOTO from 43% function to >/= 71 % function.    Status On-going      PT LONG TERM GOAL #2   Title Pt will improve her bilateral LE strength to grossly +4/5 to improve functional mobilty.    Status On-going      PT LONG TERM GOAL #3   Title Pt  will be able to report pain </= 2/10 after working a full shift.    Status On-going      PT LONG TERM GOAL #4   Title Pt will be able to improve her lumbar flexion to >/= 60 degrees to improve functional mobilty and ADL's.    Status On-going      PT LONG TERM GOAL #5   Status On-going                 Plan - 10/20/20 0917    Clinical Impression Statement Pt arriving today reporting feeling a liitle relief in her low back. Pt still reporting pain of 2/10 at rest and worsening pain first thing in the morning and when bending and lifting. Pt reporting her pain after working has improved. Pt performed supine lumbar and LE stretching prior to traction today. Mechanical traction was increased to 80 pounds max pull and 60 pounds minimun pull. Pt tolerating well. Continue skilled PT as pt tolerates.    Examination-Activity Limitations Stand;Other;Lift;Dressing;Squat;Stairs    Examination-Participation Restrictions Community Activity;Other;Occupation    Stability/Clinical Decision Making Stable/Uncomplicated    Rehab Potential Excellent    PT Frequency 2x / week    PT Duration 6 weeks    PT Treatment/Interventions Cryotherapy;Electrical Stimulation;Iontophoresis 92m/ml Dexamethasone;Moist  Heat;Traction;Ultrasound;Gait training;Stair training;Functional mobility training;Therapeutic activities;Patient/family education;Neuromuscular re-education;Balance training;Therapeutic exercise;Manual techniques;Passive range of motion;Dry needling;Taping    PT Next Visit Plan how was traction? assess for flexion and correciton for anterior pelvic tilt vs extension as she perferred felxion last time.  core strentheing, spinal mobs, hip flexor stretching,hamstring stretching, LE strengthening, modalities as needed for pain.    PT Home Exercise Plan Access Code: [email or web address]    Consulted and Agree with Plan of Care Patient           Patient will benefit from skilled therapeutic intervention in order to improve the following deficits and impairments:  Pain,Postural dysfunction,Decreased endurance,Decreased strength,Decreased activity tolerance,Difficulty walking,Impaired flexibility  Visit Diagnosis: Acute midline low back pain with left-sided sciatica  Muscle weakness (generalized)  Difficulty in walking, not elsewhere classified  PHYSICAL THERAPY DISCHARGE SUMMARY  Visits from Start of Care: 5 of 12 visits  Current functional level related to goals / functional outcomes: See above   Remaining deficits: See above   Education / Equipment: HEP Plan: Patient agrees to discharge.  Patient goals were not met. Patient is being discharged due to not returning since the last visit.  ?????        Problem List There are no problems to display for this patient.   JOretha Caprice PT, MPT 10/20/2020, 9:26 AM  [address] [facility] [address] [facility] [address] [address]Phone: [phone]  Fax:  [phone] Name: SShivangi LutzMRN: [id number] of Birth: [birth year]

## 2020-10-22 ENCOUNTER — Encounter: Payer: 59 | Admitting: Physical Therapy

## 2020-10-22 DIAGNOSIS — N939 Abnormal uterine and vaginal bleeding, unspecified: Secondary | ICD-10-CM | POA: Diagnosis not present

## 2020-10-22 DIAGNOSIS — N840 Polyp of corpus uteri: Secondary | ICD-10-CM | POA: Diagnosis not present

## 2020-10-22 DIAGNOSIS — N879 Dysplasia of cervix uteri, unspecified: Secondary | ICD-10-CM | POA: Diagnosis not present

## 2020-10-22 DIAGNOSIS — N950 Postmenopausal bleeding: Secondary | ICD-10-CM | POA: Diagnosis not present

## 2020-10-24 MED FILL — LO LOESTRIN FE 1-10 TABLET: 1 MG-10 MCG | 28 days supply | Qty: 28 | Fill #3

## 2020-10-30 DIAGNOSIS — H5202 Hypermetropia, left eye: Secondary | ICD-10-CM | POA: Diagnosis not present

## 2020-10-30 DIAGNOSIS — H524 Presbyopia: Secondary | ICD-10-CM | POA: Diagnosis not present

## 2020-11-28 MED FILL — LO LOESTRIN FE 1-10 TABLET: 1 MG-10 MCG | 84 days supply | Qty: 84 | Fill #4

## 2020-12-05 ENCOUNTER — Ambulatory Visit
Admission: RE | Admit: 2020-12-05 | Discharge: 2020-12-05 | Disposition: A | Discharge status: Home / Self Care | Payer: 59 | Source: Ambulatory Visit | Attending: Obstetrics and Gynecology | Admitting: Obstetrics and Gynecology

## 2020-12-05 ENCOUNTER — Other Ambulatory Visit: Payer: Self-pay

## 2020-12-05 DIAGNOSIS — Z1231 Encounter for screening mammogram for malignant neoplasm of breast: Secondary | ICD-10-CM | POA: Diagnosis not present

## 2020-12-08 ENCOUNTER — Other Ambulatory Visit: Payer: Self-pay | Admitting: Obstetrics and Gynecology

## 2020-12-08 DIAGNOSIS — R928 Other abnormal and inconclusive findings on diagnostic imaging of breast: Secondary | ICD-10-CM

## 2020-12-15 ENCOUNTER — Other Ambulatory Visit (HOSPITAL_BASED_OUTPATIENT_CLINIC_OR_DEPARTMENT_OTHER): Payer: Self-pay

## 2020-12-16 ENCOUNTER — Other Ambulatory Visit: Payer: Self-pay | Admitting: Obstetrics and Gynecology

## 2020-12-16 ENCOUNTER — Ambulatory Visit
Admission: RE | Admit: 2020-12-16 | Discharge: 2020-12-16 | Disposition: A | Discharge status: Home / Self Care | Payer: 59 | Source: Ambulatory Visit | Attending: Obstetrics and Gynecology | Admitting: Obstetrics and Gynecology

## 2020-12-16 ENCOUNTER — Other Ambulatory Visit: Payer: Self-pay

## 2020-12-16 DIAGNOSIS — R928 Other abnormal and inconclusive findings on diagnostic imaging of breast: Secondary | ICD-10-CM

## 2020-12-16 DIAGNOSIS — N6001 Solitary cyst of right breast: Secondary | ICD-10-CM | POA: Diagnosis not present

## 2020-12-16 DIAGNOSIS — R922 Inconclusive mammogram: Secondary | ICD-10-CM | POA: Diagnosis not present

## 2020-12-16 DIAGNOSIS — N6489 Other specified disorders of breast: Secondary | ICD-10-CM | POA: Diagnosis not present

## 2021-01-15 ENCOUNTER — Ambulatory Visit: Payer: 59 | Admitting: Internal Medicine

## 2021-01-15 ENCOUNTER — Other Ambulatory Visit (HOSPITAL_COMMUNITY): Payer: Self-pay

## 2021-01-15 ENCOUNTER — Other Ambulatory Visit: Payer: Self-pay

## 2021-01-15 ENCOUNTER — Encounter: Payer: Self-pay | Admitting: Internal Medicine

## 2021-01-15 VITALS — BP 122/70 | HR 62 | Ht 64.0 in | Wt 170.2 lb

## 2021-01-15 DIAGNOSIS — K5900 Constipation, unspecified: Secondary | ICD-10-CM

## 2021-01-15 DIAGNOSIS — R194 Change in bowel habit: Secondary | ICD-10-CM | POA: Diagnosis not present

## 2021-01-15 DIAGNOSIS — Z1211 Encounter for screening for malignant neoplasm of colon: Secondary | ICD-10-CM

## 2021-01-15 DIAGNOSIS — R140 Abdominal distension (gaseous): Secondary | ICD-10-CM | POA: Diagnosis not present

## 2021-01-15 MED ORDER — PLENVU 140 G PO SOLR
1.0000 | Freq: Once | ORAL | 0 refills | Status: AC
  Filled 2021-01-15 – 2021-02-20 (×2): qty 3, 1d supply, fill #0

## 2021-01-15 NOTE — Patient Instructions (Signed)
If you are age 50 or older, your body mass index should be between 23-30. Your Body mass index is 29.21 kg/m. If this is out of the aforementioned range listed, please consider follow up with your Primary Care Provider.  If you are age 22 or younger, your body mass index should be between 19-25. Your Body mass index is 29.21 kg/m. If this is out of the aformentioned range listed, please consider follow up with your Primary Care Provider.   You have been scheduled for a colonoscopy. Please follow written instructions given to you at your visit today.  Please pick up your prep supplies at the pharmacy within the next 1-3 days. If you use inhalers (even only as needed), please bring them with you on the day of your procedure.

## 2021-01-15 NOTE — Progress Notes (Signed)
HISTORY OF PRESENT ILLNESS:  Cathy Hickman is a 50 y.o. 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  Patient reports a long history of regular bowel movements each morning following consumption of coffee.  Over the past 2 years she has noted some issues with constipation with dietary indiscretion.  At some abdominal bloating and occasional mild abdominal discomfort.  No rectal bleeding.  No family history of colon cancer though her mother has a history of colon polyps.  Patient's GI review of systems is otherwise negative.  She is on birth control pills for postmenopausal symptoms.  She does have a history of endometrial polyp.  Review of outside records from her gynecologist are noncontributory.  She has completed her COVID vaccination series  REVIEW OF SYSTEMS:  All non-GI ROS negative unless otherwise stated in the HPI except for back pain, breast changes  Past Medical History:  Diagnosis Date  . Uterine polyp     Past Surgical History:  Procedure Laterality Date  . DILATATION AND CURETTAGE/HYSTEROSCOPY WITH MINERVA    . TONSILLECTOMY      Social History Cathy Hickman  reports that she has never smoked. She has never used smokeless tobacco. She reports current alcohol use. She reports that she does not use drugs.  family history includes Colon polyps in her mother; Diabetes in her paternal grandmother; Hypertension in her brother and mother; Non-Hodgkin's lymphoma in her maternal grandmother.  No Known Allergies     PHYSICAL EXAMINATION: Vital signs: BP 122/70   Pulse 62   Ht 5\' 4"  (1.626 m)   Wt 170 lb 3.2 oz (77.2 kg)   LMP 03/13/2014   SpO2 99%   BMI 29.21 kg/m   Constitutional: Pleasant, generally well-appearing, no acute distress Psychiatric: alert and oriented x3, cooperative Eyes: extraocular movements intact, anicteric, conjunctiva pink Mouth: Mask  in Neck: supple no lymphadenopathy Cardiovascular: heart regular rate and rhythm, no murmur Lungs: clear to auscultation bilaterally Abdomen: soft, nontender, nondistended, no obvious ascites, no peritoneal signs, normal bowel sounds, no organomegaly Rectal: Deferred until colonoscopy Extremities: no clubbing, cyanosis, or lower extremity edema bilaterally Skin: no lesions on visible extremities Neuro: No focal deficits.  Cranial nerves intact  ASSESSMENT:  1.  Screening colonoscopy.  Appropriate candidate without contraindication. 2.  Mild change in bowel habits as described   PLAN:  1.  Colonoscopy.The nature of the procedure, as well as the risks, benefits, and alternatives were carefully and thoroughly reviewed with the patient. Ample time for discussion and questions allowed. The patient understood, was satisfied, and agreed to proceed. 2.  Increase fiber and water.  She is aware

## 2021-01-26 ENCOUNTER — Other Ambulatory Visit (HOSPITAL_COMMUNITY): Payer: Self-pay

## 2021-02-20 ENCOUNTER — Other Ambulatory Visit (HOSPITAL_COMMUNITY): Payer: Self-pay

## 2021-02-20 MED FILL — Norethin-Eth Estradiol-Fe Tab 1 MG-10 MCG (24)/10 MCG (2): ORAL | 84 days supply | Qty: 84 | Fill #0 | Status: AC

## 2021-03-20 ENCOUNTER — Encounter: Payer: Self-pay | Admitting: Internal Medicine

## 2021-03-27 ENCOUNTER — Encounter: Payer: 59 | Admitting: Internal Medicine

## 2021-05-06 ENCOUNTER — Other Ambulatory Visit (HOSPITAL_COMMUNITY): Payer: Self-pay

## 2021-05-06 MED FILL — Norethin-Eth Estradiol-Fe Tab 1 MG-10 MCG (24)/10 MCG (2): ORAL | 84 days supply | Qty: 84 | Fill #1 | Status: AC

## 2021-05-15 ENCOUNTER — Other Ambulatory Visit (HOSPITAL_COMMUNITY): Payer: Self-pay

## 2021-05-15 DIAGNOSIS — Z780 Asymptomatic menopausal state: Secondary | ICD-10-CM | POA: Diagnosis not present

## 2021-05-15 DIAGNOSIS — L304 Erythema intertrigo: Secondary | ICD-10-CM | POA: Diagnosis not present

## 2021-05-15 DIAGNOSIS — Z01419 Encounter for gynecological examination (general) (routine) without abnormal findings: Secondary | ICD-10-CM | POA: Diagnosis not present

## 2021-05-15 DIAGNOSIS — Z6829 Body mass index (BMI) 29.0-29.9, adult: Secondary | ICD-10-CM | POA: Diagnosis not present

## 2021-05-15 MED ORDER — CLOTRIMAZOLE-BETAMETHASONE 1-0.05 % EX CREA
TOPICAL_CREAM | CUTANEOUS | 3 refills | Status: AC
  Filled 2021-05-15: qty 45, 14d supply, fill #0

## 2021-05-15 MED ORDER — LO LOESTRIN FE 1 MG-10 MCG / 10 MCG PO TABS
1.0000 | ORAL_TABLET | Freq: Every day | ORAL | 12 refills | Status: DC
  Filled 2021-05-15 – 2021-07-20 (×3): qty 28, 28d supply, fill #0
  Filled 2021-07-28 (×2): qty 84, 84d supply, fill #0
  Filled 2021-10-26: qty 84, 84d supply, fill #1
  Filled 2022-02-12: qty 84, 84d supply, fill #2
  Filled 2022-05-11: qty 28, 28d supply, fill #3
  Filled 2022-05-11: qty 84, 84d supply, fill #3
  Filled 2022-05-11: qty 56, 56d supply, fill #3

## 2021-05-16 ENCOUNTER — Other Ambulatory Visit (HOSPITAL_COMMUNITY): Payer: Self-pay

## 2021-05-16 MED ORDER — CLOTRIMAZOLE-BETAMETHASONE 1-0.05 % EX CREA
TOPICAL_CREAM | CUTANEOUS | 3 refills | Status: DC
  Filled 2021-05-16: qty 15, 10d supply, fill #0

## 2021-05-18 ENCOUNTER — Other Ambulatory Visit (HOSPITAL_COMMUNITY): Payer: Self-pay

## 2021-05-25 ENCOUNTER — Other Ambulatory Visit (HOSPITAL_COMMUNITY): Payer: Self-pay

## 2021-06-01 ENCOUNTER — Other Ambulatory Visit (HOSPITAL_COMMUNITY): Payer: Self-pay

## 2021-06-01 DIAGNOSIS — D2271 Melanocytic nevi of right lower limb, including hip: Secondary | ICD-10-CM | POA: Diagnosis not present

## 2021-06-01 DIAGNOSIS — D2262 Melanocytic nevi of left upper limb, including shoulder: Secondary | ICD-10-CM | POA: Diagnosis not present

## 2021-06-01 DIAGNOSIS — L821 Other seborrheic keratosis: Secondary | ICD-10-CM | POA: Diagnosis not present

## 2021-06-01 DIAGNOSIS — D225 Melanocytic nevi of trunk: Secondary | ICD-10-CM | POA: Diagnosis not present

## 2021-06-01 DIAGNOSIS — D2261 Melanocytic nevi of right upper limb, including shoulder: Secondary | ICD-10-CM | POA: Diagnosis not present

## 2021-06-01 DIAGNOSIS — Z85828 Personal history of other malignant neoplasm of skin: Secondary | ICD-10-CM | POA: Diagnosis not present

## 2021-06-01 DIAGNOSIS — D2272 Melanocytic nevi of left lower limb, including hip: Secondary | ICD-10-CM | POA: Diagnosis not present

## 2021-06-01 DIAGNOSIS — C44311 Basal cell carcinoma of skin of nose: Secondary | ICD-10-CM | POA: Diagnosis not present

## 2021-06-01 DIAGNOSIS — D2372 Other benign neoplasm of skin of left lower limb, including hip: Secondary | ICD-10-CM | POA: Diagnosis not present

## 2021-06-01 MED ORDER — FLUCONAZOLE 200 MG PO TABS
200.0000 mg | ORAL_TABLET | Freq: Every day | ORAL | 0 refills | Status: DC
  Filled 2021-06-01 – 2021-07-20 (×2): qty 3, 3d supply, fill #0

## 2021-06-05 ENCOUNTER — Other Ambulatory Visit (HOSPITAL_BASED_OUTPATIENT_CLINIC_OR_DEPARTMENT_OTHER): Payer: Self-pay | Admitting: Nurse Practitioner

## 2021-06-05 ENCOUNTER — Other Ambulatory Visit (HOSPITAL_COMMUNITY): Payer: Self-pay

## 2021-06-05 ENCOUNTER — Ambulatory Visit (HOSPITAL_BASED_OUTPATIENT_CLINIC_OR_DEPARTMENT_OTHER)
Admission: RE | Admit: 2021-06-05 | Discharge: 2021-06-05 | Disposition: A | Discharge status: Home / Self Care | Payer: 59 | Source: Ambulatory Visit | Attending: Nurse Practitioner | Admitting: Nurse Practitioner

## 2021-06-05 ENCOUNTER — Other Ambulatory Visit: Payer: Self-pay

## 2021-06-05 DIAGNOSIS — K824 Cholesterolosis of gallbladder: Secondary | ICD-10-CM | POA: Diagnosis not present

## 2021-06-05 DIAGNOSIS — R1011 Right upper quadrant pain: Secondary | ICD-10-CM | POA: Diagnosis not present

## 2021-06-05 DIAGNOSIS — K7689 Other specified diseases of liver: Secondary | ICD-10-CM | POA: Diagnosis not present

## 2021-06-05 MED ORDER — ONDANSETRON 8 MG PO TBDP
8.0000 mg | ORAL_TABLET | Freq: Three times a day (TID) | ORAL | 0 refills | Status: DC | PRN
  Filled 2021-06-05: qty 20, 7d supply, fill #0

## 2021-06-08 ENCOUNTER — Other Ambulatory Visit: Payer: Self-pay

## 2021-06-08 ENCOUNTER — Ambulatory Visit (AMBULATORY_SURGERY_CENTER): Payer: 59 | Admitting: *Deleted

## 2021-06-08 VITALS — Ht 63.5 in | Wt 168.0 lb

## 2021-06-08 DIAGNOSIS — Z1211 Encounter for screening for malignant neoplasm of colon: Secondary | ICD-10-CM

## 2021-06-08 NOTE — Progress Notes (Signed)
No egg or soy allergy known to patient  No issues known to pt with past sedation with any surgeries or procedures Patient denies ever being told they had issues or difficulty with intubation  No FH of Malignant Hyperthermia Pt is not on diet pills Pt is not on  home 02  Pt is not on blood thinners  Pt denies issues with constipation - OCC - can tell if not enough water and can change and soften stools  No A fib or A flutter  EMMI video to pt or via MyChart   Pt is fully vaccinated  for Covid   Due to the COVID-19 pandemic we are asking patients to follow certain guidelines.  Pt aware of COVID protocols and [facility] guidelines   Pt verified name, DOB, address and insurance during PV today.  Pt mailed instruction packet of Emmi video, copy of consent form to read and not return, and instructions. PV completed over the phone.  Pt encouraged to call with questions or issues.  My Chart instructions to pt as well   Plenvu at home per pt-

## 2021-06-09 ENCOUNTER — Other Ambulatory Visit (HOSPITAL_COMMUNITY): Payer: Self-pay

## 2021-06-09 ENCOUNTER — Encounter: Payer: Self-pay | Admitting: Internal Medicine

## 2021-06-15 ENCOUNTER — Other Ambulatory Visit (HOSPITAL_COMMUNITY): Payer: Self-pay

## 2021-06-22 ENCOUNTER — Ambulatory Visit (AMBULATORY_SURGERY_CENTER): Payer: 59 | Admitting: Internal Medicine

## 2021-06-22 ENCOUNTER — Encounter: Payer: Self-pay | Admitting: Internal Medicine

## 2021-06-22 VITALS — BP 131/84 | HR 73 | Temp 98.4°F | Resp 15 | Ht 64.0 in | Wt 168.0 lb

## 2021-06-22 DIAGNOSIS — D120 Benign neoplasm of cecum: Secondary | ICD-10-CM

## 2021-06-22 DIAGNOSIS — D122 Benign neoplasm of ascending colon: Secondary | ICD-10-CM | POA: Diagnosis not present

## 2021-06-22 DIAGNOSIS — Z1211 Encounter for screening for malignant neoplasm of colon: Secondary | ICD-10-CM

## 2021-06-22 HISTORY — PX: COLONOSCOPY: SHX174

## 2021-06-22 MED ORDER — SODIUM CHLORIDE 0.9 % IV SOLN: 500.0000 mL | Freq: Once | INTRAVENOUS | Status: DC

## 2021-06-22 NOTE — Progress Notes (Signed)
Called to room to assist during endoscopic procedure.  Patient ID and intended procedure confirmed with present staff. Received instructions for my participation in the procedure from the performing physician.  

## 2021-06-22 NOTE — Progress Notes (Signed)
Sedate, gd SR, tolerated procedure well, VSS, report to RN 

## 2021-06-22 NOTE — Progress Notes (Signed)
HISTORY OF PRESENT ILLNESS:  Cathy Hickman is a 50 y.o. female who presents today for screening colonoscopy.  She was evaluated in the office Jan 15, 2021 regarding the same.  See that dictation.  No interval changes  REVIEW OF SYSTEMS:  All non-GI ROS negative. Past Medical History:  Diagnosis Date   Allergy    Anemia    slight with pregnancy   GERD (gastroesophageal reflux disease)    Uterine polyp     Past Surgical History:  Procedure Laterality Date   COLONOSCOPY     as teenager   DILATATION AND CURETTAGE/HYSTEROSCOPY WITH MINERVA     TONSILLECTOMY     age 75   UPPER GASTROINTESTINAL ENDOSCOPY     as teenager    Social History Cathy Hickman  reports that she has never smoked. She has never used smokeless tobacco. She reports current alcohol use. She reports that she does not use drugs.  family history includes Colon polyps in her mother; Diabetes in her paternal grandmother; Hypertension in her brother and mother; Non-Hodgkin's lymphoma in her maternal grandmother.  No Known Allergies     PHYSICAL EXAMINATION:  Vital signs: BP (!) 143/86 (Patient Position: Sitting)   Pulse 63   Temp 98.4 F (36.9 C)   Ht 5\' 4"  (1.626 m)   Wt 168 lb (76.2 kg)   LMP 03/13/2014   SpO2 100%   BMI 28.84 kg/m  General: Well-developed, well-nourished, no acute distress HEENT: Sclerae are anicteric, conjunctiva pink. Oral mucosa intact Lungs: Clear Heart: Regular Abdomen: soft, nontender, nondistended, no obvious ascites, no peritoneal signs, normal bowel sounds. No organomegaly. Extremities: No edema Psychiatric: alert and oriented x3. Cooperative     ASSESSMENT:  1.  Colon cancer screening   PLAN:   1.  Screening colonoscopy

## 2021-06-22 NOTE — Patient Instructions (Signed)
Resume previous diet and continue present medications. Repeat colonoscopy in 3 years for surveillance. Awaiting pathology results.  YOU HAD AN ENDOSCOPIC PROCEDURE TODAY AT [facility]:   Refer to the procedure report that was given to you for any specific questions about what was found during the examination.  If the procedure report does not answer your questions, please call your gastroenterologist to clarify.  If you requested that your care partner not be given the details of your procedure findings, then the procedure report has been included in a sealed envelope for you to review at your convenience later.  YOU SHOULD EXPECT: Some feelings of bloating in the abdomen. Passage of more gas than usual.  Walking can help get rid of the air that was put into your GI tract during the procedure and reduce the bloating. If you had a lower endoscopy (such as a colonoscopy or flexible sigmoidoscopy) you may notice spotting of blood in your stool or on the toilet paper. If you underwent a bowel prep for your procedure, you may not have a normal bowel movement for a few days.  Please Note:  You might notice some irritation and congestion in your nose or some drainage.  This is from the oxygen used during your procedure.  There is no need for concern and it should clear up in a day or so.  SYMPTOMS TO REPORT IMMEDIATELY:  Following lower endoscopy (colonoscopy or flexible sigmoidoscopy):  Excessive amounts of blood in the stool  Significant tenderness or worsening of abdominal pains  Swelling of the abdomen that is new, acute  Fever of 100F or higher   For urgent or emergent issues, a gastroenterologist can be reached at any hour by calling [phone]. Do not use MyChart messaging for urgent concerns.    DIET:  We do recommend a small meal at first, but then you may proceed to your regular diet.  Drink plenty of fluids but you should avoid alcoholic beverages for 24  hours.  ACTIVITY:  You should plan to take it easy for the rest of today and you should NOT DRIVE or use heavy machinery until tomorrow (because of the sedation medicines used during the test).    FOLLOW UP: Our staff will call the number listed on your records 48-72 hours following your procedure to check on you and address any questions or concerns that you may have regarding the information given to you following your procedure. If we do not reach you, we will leave a message.  We will attempt to reach you two times.  During this call, we will ask if you have developed any symptoms of COVID 19. If you develop any symptoms (ie: fever, flu-like symptoms, shortness of breath, cough etc.) before then, please call [phone].  If you test positive for Covid 19 in the 2 weeks post procedure, please call and report this information to [place].    If any biopsies were taken you will be contacted by phone or by letter within the next 1-3 weeks.  Please call us at [phone] if you have not heard about the biopsies in 3 weeks.    SIGNATURES/CONFIDENTIALITY: You and/or your care partner have signed paperwork which will be entered into your electronic medical record.  These signatures attest to the fact that that the information above on your After Visit Summary has been reviewed and is understood.  Full responsibility of the confidentiality of this discharge information lies with you and/or your care-partner.

## 2021-06-22 NOTE — Op Note (Signed)
[facility] Patient Name: Cathy Hickman Procedure Date: 06/22/2021 10:00 AM MRN: [id number] Endoscopist: Docia Chuck. Henrene Pastor , MD Age: 50 Referring MD:  Date of Birth: Jun 16, 1971 Gender: Female Account #: [id number] Procedure:                Colonoscopy with cold snare polypectomy x 2 Indications:              Screening for colorectal malignant neoplasm Medicines:                Monitored Anesthesia Care Procedure:                Pre-Anesthesia Assessment:                           - Prior to the procedure, a History and Physical                            was performed, and patient medications and                            allergies were reviewed. The patient's tolerance of                            previous anesthesia was also reviewed. The risks                            and benefits of the procedure and the sedation                            options and risks were discussed with the patient.                            All questions were answered, and informed consent                            was obtained. Prior Anticoagulants: The patient has                            taken no previous anticoagulant or antiplatelet                            agents. ASA Grade Assessment: I - A normal, healthy                            patient. After reviewing the risks and benefits,                            the patient was deemed in satisfactory condition to                            undergo the procedure.                           After obtaining informed consent, the colonoscope  was passed under direct vision. Throughout the                            procedure, the patient's blood pressure, pulse, and                            oxygen saturations were monitored continuously. The                            Olympus [id number]) Colonoscope was                            introduced through the anus and advanced to the the                             cecum, identified by appendiceal orifice and                            ileocecal valve. The ileocecal valve, appendiceal                            orifice, and rectum were photographed. The quality                            of the bowel preparation was excellent. The                            colonoscopy was performed without difficulty. The                            patient tolerated the procedure well. The bowel                            preparation used was Plenvu via split dose                            instruction. Scope In: 10:19:24 AM Scope Out: 10:30:11 AM Scope Withdrawal Time: 0 hours 8 minutes 20 seconds  Total Procedure Duration: 0 hours 10 minutes 47 seconds  Findings:                 Two polyps were found in the ascending colon and                            cecum. The polyps were 4 to 10 mm in size. These                            polyps were removed with a cold snare. Resection                            and retrieval were complete.                           Multiple diverticula were found in the entire colon.  Internal hemorrhoids were found during                            retroflexion. The hemorrhoids were small.                           The exam was otherwise without abnormality on                            direct and retroflexion views. Complications:            No immediate complications. Estimated blood loss:                            None. Estimated Blood Loss:     Estimated blood loss: none. Impression:               - Two 4 to 10 mm polyps in the ascending colon and                            in the cecum, removed with a cold snare. Resected                            and retrieved.                           - Diverticulosis in the entire examined colon.                           - Internal hemorrhoids.                           - The examination was otherwise normal on direct                            and retroflexion  views. Recommendation:           - Repeat colonoscopy in 3 years for surveillance.                           - Patient has a contact number available for                            emergencies. The signs and symptoms of potential                            delayed complications were discussed with the                            patient. Return to normal activities tomorrow.                            Written discharge instructions were provided to the                            patient.                           -  Resume previous diet.                           - Continue present medications.                           - Await pathology results. Docia Chuck. Henrene Pastor, MD 06/22/2021 10:36:00 AM This report has been signed electronically.

## 2021-06-22 NOTE — Progress Notes (Signed)
Pt's states no medical or surgical changes since previsit or office visit.   Check-in-AM  V/S-CW

## 2021-06-24 ENCOUNTER — Other Ambulatory Visit: Payer: Self-pay | Admitting: Obstetrics and Gynecology

## 2021-06-24 ENCOUNTER — Ambulatory Visit
Admission: RE | Admit: 2021-06-24 | Discharge: 2021-06-24 | Disposition: A | Discharge status: Home / Self Care | Payer: 59 | Source: Ambulatory Visit | Attending: Obstetrics and Gynecology | Admitting: Obstetrics and Gynecology

## 2021-06-24 ENCOUNTER — Other Ambulatory Visit: Payer: Self-pay

## 2021-06-24 ENCOUNTER — Telehealth: Payer: Self-pay | Admitting: *Deleted

## 2021-06-24 DIAGNOSIS — N6311 Unspecified lump in the right breast, upper outer quadrant: Secondary | ICD-10-CM | POA: Diagnosis not present

## 2021-06-24 DIAGNOSIS — R928 Other abnormal and inconclusive findings on diagnostic imaging of breast: Secondary | ICD-10-CM

## 2021-06-24 DIAGNOSIS — N6312 Unspecified lump in the right breast, upper inner quadrant: Secondary | ICD-10-CM | POA: Diagnosis not present

## 2021-06-24 DIAGNOSIS — R922 Inconclusive mammogram: Secondary | ICD-10-CM | POA: Diagnosis not present

## 2021-06-24 NOTE — Telephone Encounter (Signed)
  Follow up Call-  Call back number 06/22/2021  Post procedure Call Back phone  # [phone]  Permission to leave phone message Yes  Some recent data might be hidden     Patient questions:  Do you have a fever, pain , or abdominal swelling? No. Pain Score  0 *  Have you tolerated food without any problems? Yes.    Have you been able to return to your normal activities? Yes.    Do you have any questions about your discharge instructions: Diet   No. Medications  No. Follow up visit  No.  Do you have questions or concerns about your Care? No.  Actions: * If pain score is 4 or above: No action needed, pain <4.  Have you developed a fever since your procedure? no  2.   Have you had an respiratory symptoms (SOB or cough) since your procedure? no  3.   Have you tested positive for COVID 19 since your procedure no  4.   Have you had any family members/close contacts diagnosed with the COVID 19 since your procedure?  no   If yes to any of these questions please route to Joylene John, RN and Joella Prince, RN

## 2021-06-25 ENCOUNTER — Encounter: Payer: Self-pay | Admitting: Internal Medicine

## 2021-07-20 ENCOUNTER — Other Ambulatory Visit (HOSPITAL_COMMUNITY): Payer: Self-pay

## 2021-07-20 DIAGNOSIS — Z85828 Personal history of other malignant neoplasm of skin: Secondary | ICD-10-CM | POA: Diagnosis not present

## 2021-07-20 DIAGNOSIS — C44311 Basal cell carcinoma of skin of nose: Secondary | ICD-10-CM | POA: Diagnosis not present

## 2021-07-20 MED ORDER — DOXYCYCLINE HYCLATE 100 MG PO CAPS
100.0000 mg | ORAL_CAPSULE | Freq: Two times a day (BID) | ORAL | 0 refills | Status: DC
  Filled 2021-07-20: qty 10, 5d supply, fill #0

## 2021-07-28 ENCOUNTER — Other Ambulatory Visit (HOSPITAL_COMMUNITY): Payer: Self-pay

## 2021-08-06 ENCOUNTER — Other Ambulatory Visit (HOSPITAL_COMMUNITY): Payer: Self-pay

## 2021-08-10 ENCOUNTER — Other Ambulatory Visit: Payer: Self-pay | Admitting: Emergency Medicine

## 2021-08-10 ENCOUNTER — Other Ambulatory Visit (HOSPITAL_COMMUNITY): Payer: Self-pay

## 2021-08-10 MED ORDER — SPACER/AERO-HOLDING CHAMBERS DEVI
1.0000 | 0 refills | Status: DC | PRN
Start: 2021-08-10 — End: 2024-06-11
  Filled 2021-08-10: qty 1, 1d supply, fill #0

## 2021-08-10 MED ORDER — AMOXICILLIN-POT CLAVULANATE 875-125 MG PO TABS
1.0000 | ORAL_TABLET | Freq: Two times a day (BID) | ORAL | 0 refills | Status: DC
  Filled 2021-08-10: qty 14, 7d supply, fill #0

## 2021-08-10 MED ORDER — ALBUTEROL SULFATE HFA 108 (90 BASE) MCG/ACT IN AERS
2.0000 | INHALATION_SPRAY | Freq: Four times a day (QID) | RESPIRATORY_TRACT | 0 refills | Status: DC | PRN
Start: 2021-08-10 — End: 2024-06-11
  Filled 2021-08-10: qty 6.7, 25d supply, fill #0

## 2021-08-10 NOTE — Progress Notes (Signed)
Seen via [facility]. Note in [facility].

## 2021-09-28 ENCOUNTER — Ambulatory Visit: Payer: Self-pay

## 2021-09-28 ENCOUNTER — Other Ambulatory Visit (INDEPENDENT_AMBULATORY_CARE_PROVIDER_SITE_OTHER): Payer: 59 | Admitting: Radiology

## 2021-09-28 DIAGNOSIS — M79642 Pain in left hand: Secondary | ICD-10-CM

## 2021-10-12 DIAGNOSIS — D485 Neoplasm of uncertain behavior of skin: Secondary | ICD-10-CM | POA: Diagnosis not present

## 2021-10-12 DIAGNOSIS — D2239 Melanocytic nevi of other parts of face: Secondary | ICD-10-CM | POA: Diagnosis not present

## 2021-10-26 ENCOUNTER — Other Ambulatory Visit (HOSPITAL_COMMUNITY): Payer: Self-pay

## 2021-12-03 ENCOUNTER — Other Ambulatory Visit (HOSPITAL_COMMUNITY): Payer: Self-pay

## 2021-12-03 MED ORDER — AMOXICILLIN-POT CLAVULANATE 875-125 MG PO TABS
1.0000 | ORAL_TABLET | Freq: Two times a day (BID) | ORAL | 0 refills | Status: DC
  Filled 2021-12-03: qty 14, 7d supply, fill #0

## 2021-12-23 DIAGNOSIS — Z85828 Personal history of other malignant neoplasm of skin: Secondary | ICD-10-CM | POA: Diagnosis not present

## 2021-12-23 DIAGNOSIS — D225 Melanocytic nevi of trunk: Secondary | ICD-10-CM | POA: Diagnosis not present

## 2021-12-23 DIAGNOSIS — L821 Other seborrheic keratosis: Secondary | ICD-10-CM | POA: Diagnosis not present

## 2021-12-23 DIAGNOSIS — D1801 Hemangioma of skin and subcutaneous tissue: Secondary | ICD-10-CM | POA: Diagnosis not present

## 2021-12-23 DIAGNOSIS — C44519 Basal cell carcinoma of skin of other part of trunk: Secondary | ICD-10-CM | POA: Diagnosis not present

## 2021-12-23 DIAGNOSIS — D2372 Other benign neoplasm of skin of left lower limb, including hip: Secondary | ICD-10-CM | POA: Diagnosis not present

## 2021-12-23 DIAGNOSIS — D485 Neoplasm of uncertain behavior of skin: Secondary | ICD-10-CM | POA: Diagnosis not present

## 2021-12-23 DIAGNOSIS — D2262 Melanocytic nevi of left upper limb, including shoulder: Secondary | ICD-10-CM | POA: Diagnosis not present

## 2021-12-23 DIAGNOSIS — D2261 Melanocytic nevi of right upper limb, including shoulder: Secondary | ICD-10-CM | POA: Diagnosis not present

## 2021-12-23 DIAGNOSIS — D2272 Melanocytic nevi of left lower limb, including hip: Secondary | ICD-10-CM | POA: Diagnosis not present

## 2021-12-24 ENCOUNTER — Ambulatory Visit
Admission: RE | Admit: 2021-12-24 | Discharge: 2021-12-24 | Disposition: A | Discharge status: Home / Self Care | Payer: 59 | Source: Ambulatory Visit | Attending: Obstetrics and Gynecology | Admitting: Obstetrics and Gynecology

## 2021-12-24 DIAGNOSIS — R928 Other abnormal and inconclusive findings on diagnostic imaging of breast: Secondary | ICD-10-CM

## 2021-12-24 DIAGNOSIS — N6489 Other specified disorders of breast: Secondary | ICD-10-CM | POA: Diagnosis not present

## 2021-12-24 DIAGNOSIS — R922 Inconclusive mammogram: Secondary | ICD-10-CM | POA: Diagnosis not present

## 2022-02-12 ENCOUNTER — Other Ambulatory Visit (HOSPITAL_COMMUNITY): Payer: Self-pay

## 2022-02-17 ENCOUNTER — Other Ambulatory Visit (HOSPITAL_COMMUNITY): Payer: Self-pay

## 2022-04-08 DIAGNOSIS — H5203 Hypermetropia, bilateral: Secondary | ICD-10-CM | POA: Diagnosis not present

## 2022-04-08 DIAGNOSIS — H524 Presbyopia: Secondary | ICD-10-CM | POA: Diagnosis not present

## 2022-04-08 DIAGNOSIS — H52223 Regular astigmatism, bilateral: Secondary | ICD-10-CM | POA: Diagnosis not present

## 2022-05-11 ENCOUNTER — Other Ambulatory Visit (HOSPITAL_COMMUNITY): Payer: Self-pay

## 2022-05-12 ENCOUNTER — Other Ambulatory Visit (HOSPITAL_COMMUNITY): Payer: Self-pay

## 2022-05-27 ENCOUNTER — Other Ambulatory Visit (HOSPITAL_COMMUNITY): Payer: Self-pay

## 2022-05-28 ENCOUNTER — Other Ambulatory Visit (HOSPITAL_COMMUNITY): Payer: Self-pay

## 2022-06-11 ENCOUNTER — Other Ambulatory Visit (HOSPITAL_COMMUNITY): Payer: Self-pay

## 2022-06-11 MED ORDER — LO LOESTRIN FE 1 MG-10 MCG / 10 MCG PO TABS
1.0000 | ORAL_TABLET | Freq: Every day | ORAL | 0 refills | Status: DC
  Filled 2022-06-11: qty 28, 28d supply, fill #0

## 2022-06-22 ENCOUNTER — Other Ambulatory Visit (HOSPITAL_COMMUNITY): Payer: Self-pay

## 2022-06-22 DIAGNOSIS — D2261 Melanocytic nevi of right upper limb, including shoulder: Secondary | ICD-10-CM | POA: Diagnosis not present

## 2022-06-22 DIAGNOSIS — Z85828 Personal history of other malignant neoplasm of skin: Secondary | ICD-10-CM | POA: Diagnosis not present

## 2022-06-22 DIAGNOSIS — D1801 Hemangioma of skin and subcutaneous tissue: Secondary | ICD-10-CM | POA: Diagnosis not present

## 2022-06-22 DIAGNOSIS — D225 Melanocytic nevi of trunk: Secondary | ICD-10-CM | POA: Diagnosis not present

## 2022-06-22 DIAGNOSIS — D2271 Melanocytic nevi of right lower limb, including hip: Secondary | ICD-10-CM | POA: Diagnosis not present

## 2022-06-22 DIAGNOSIS — L812 Freckles: Secondary | ICD-10-CM | POA: Diagnosis not present

## 2022-06-22 DIAGNOSIS — D2262 Melanocytic nevi of left upper limb, including shoulder: Secondary | ICD-10-CM | POA: Diagnosis not present

## 2022-06-22 DIAGNOSIS — L821 Other seborrheic keratosis: Secondary | ICD-10-CM | POA: Diagnosis not present

## 2022-06-22 DIAGNOSIS — D2272 Melanocytic nevi of left lower limb, including hip: Secondary | ICD-10-CM | POA: Diagnosis not present

## 2022-06-22 MED ORDER — TRETINOIN 0.025 % EX CREA
TOPICAL_CREAM | Freq: Every day | CUTANEOUS | 3 refills | Status: DC
  Filled 2022-06-22: qty 20, 30d supply, fill #0

## 2022-06-25 ENCOUNTER — Other Ambulatory Visit (HOSPITAL_COMMUNITY): Payer: Self-pay

## 2022-06-25 DIAGNOSIS — F411 Generalized anxiety disorder: Secondary | ICD-10-CM | POA: Diagnosis not present

## 2022-06-25 DIAGNOSIS — Z01419 Encounter for gynecological examination (general) (routine) without abnormal findings: Secondary | ICD-10-CM | POA: Diagnosis not present

## 2022-06-25 DIAGNOSIS — Z760 Encounter for issue of repeat prescription: Secondary | ICD-10-CM | POA: Diagnosis not present

## 2022-06-25 DIAGNOSIS — Z124 Encounter for screening for malignant neoplasm of cervix: Secondary | ICD-10-CM | POA: Diagnosis not present

## 2022-06-25 DIAGNOSIS — Z6830 Body mass index (BMI) 30.0-30.9, adult: Secondary | ICD-10-CM | POA: Diagnosis not present

## 2022-06-25 MED ORDER — LO LOESTRIN FE 1 MG-10 MCG / 10 MCG PO TABS
1.0000 | ORAL_TABLET | Freq: Every day | ORAL | 12 refills | Status: DC
  Filled 2022-06-25 – 2022-07-09 (×2): qty 84, 84d supply, fill #0
  Filled 2022-10-01: qty 84, 84d supply, fill #1
  Filled 2022-12-20: qty 84, 84d supply, fill #2
  Filled 2023-03-04: qty 84, 84d supply, fill #3
  Filled 2023-05-25: qty 84, 84d supply, fill #4

## 2022-06-25 MED ORDER — ESCITALOPRAM OXALATE 10 MG PO TABS
10.0000 mg | ORAL_TABLET | Freq: Every day | ORAL | 12 refills | Status: DC
  Filled 2022-06-25: qty 30, 30d supply, fill #0
  Filled 2022-07-26: qty 30, 30d supply, fill #1
  Filled 2022-08-26: qty 30, 30d supply, fill #2
  Filled 2022-10-01: qty 30, 30d supply, fill #3
  Filled 2022-11-12: qty 30, 30d supply, fill #4
  Filled 2022-12-13: qty 30, 30d supply, fill #5
  Filled 2023-01-19: qty 30, 30d supply, fill #6
  Filled 2023-02-25: qty 30, 30d supply, fill #7
  Filled 2023-03-30: qty 30, 30d supply, fill #8
  Filled 2023-05-03: qty 30, 30d supply, fill #9
  Filled 2023-05-25 – 2023-05-27 (×4): qty 30, 30d supply, fill #10

## 2022-07-02 DIAGNOSIS — Z1329 Encounter for screening for other suspected endocrine disorder: Secondary | ICD-10-CM | POA: Diagnosis not present

## 2022-07-02 DIAGNOSIS — Z13220 Encounter for screening for lipoid disorders: Secondary | ICD-10-CM | POA: Diagnosis not present

## 2022-07-02 DIAGNOSIS — E559 Vitamin D deficiency, unspecified: Secondary | ICD-10-CM | POA: Diagnosis not present

## 2022-07-02 DIAGNOSIS — Z131 Encounter for screening for diabetes mellitus: Secondary | ICD-10-CM | POA: Diagnosis not present

## 2022-07-02 DIAGNOSIS — Z130 Encounter for screening for diseases of the blood and blood-forming organs and certain disorders involving the immune mechanism: Secondary | ICD-10-CM | POA: Diagnosis not present

## 2022-07-02 DIAGNOSIS — Z13228 Encounter for screening for other metabolic disorders: Secondary | ICD-10-CM | POA: Diagnosis not present

## 2022-07-09 ENCOUNTER — Other Ambulatory Visit (HOSPITAL_COMMUNITY): Payer: Self-pay

## 2022-07-26 ENCOUNTER — Other Ambulatory Visit (HOSPITAL_COMMUNITY): Payer: Self-pay

## 2022-08-26 ENCOUNTER — Other Ambulatory Visit (HOSPITAL_COMMUNITY): Payer: Self-pay

## 2022-09-15 ENCOUNTER — Other Ambulatory Visit (HOSPITAL_COMMUNITY): Payer: Self-pay

## 2022-09-15 MED ORDER — FLUCONAZOLE 150 MG PO TABS
150.0000 mg | ORAL_TABLET | ORAL | 0 refills | Status: DC
  Filled 2022-09-15: qty 2, 2d supply, fill #0

## 2022-10-01 ENCOUNTER — Other Ambulatory Visit (HOSPITAL_COMMUNITY): Payer: Self-pay

## 2022-11-09 IMAGING — MG DIGITAL DIAGNOSTIC BILAT W/ TOMO W/ CAD
8 series · 8 of 24 positions shown · non-contrast
Comparison: Previous exam(s).

CLINICAL DATA: 49-year-old female recalled from screening mammogram
dated 12/05/2020 for possible bilateral masses.



[R CC synth-2D]
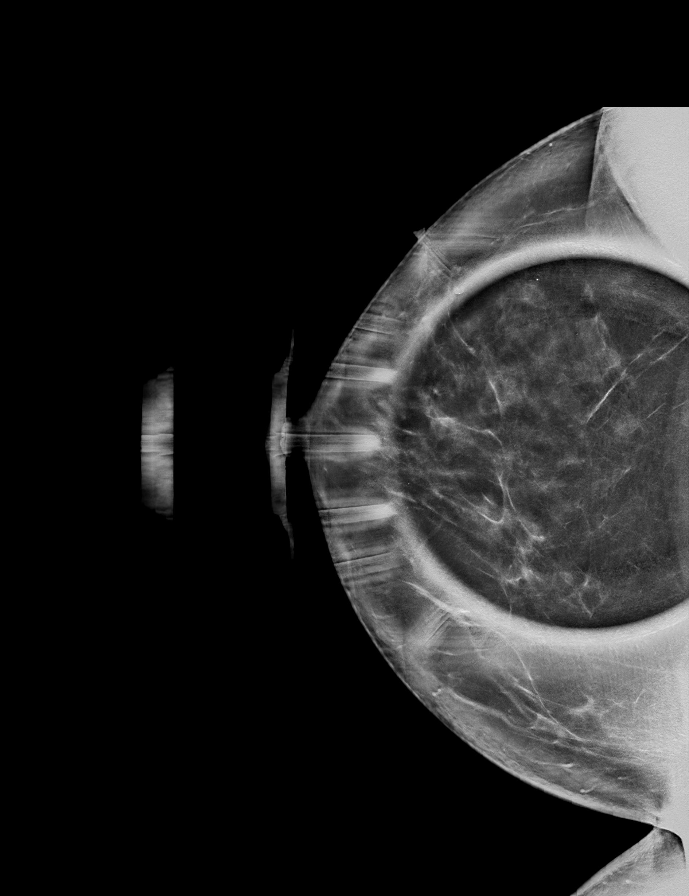

[R MLO synth-2D]
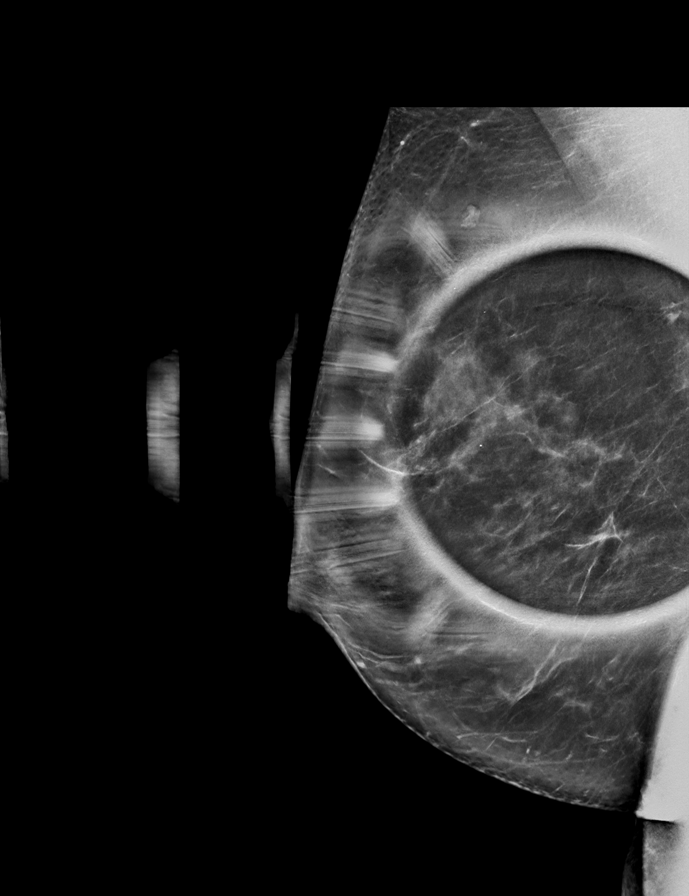

[L MLO synth-2D]
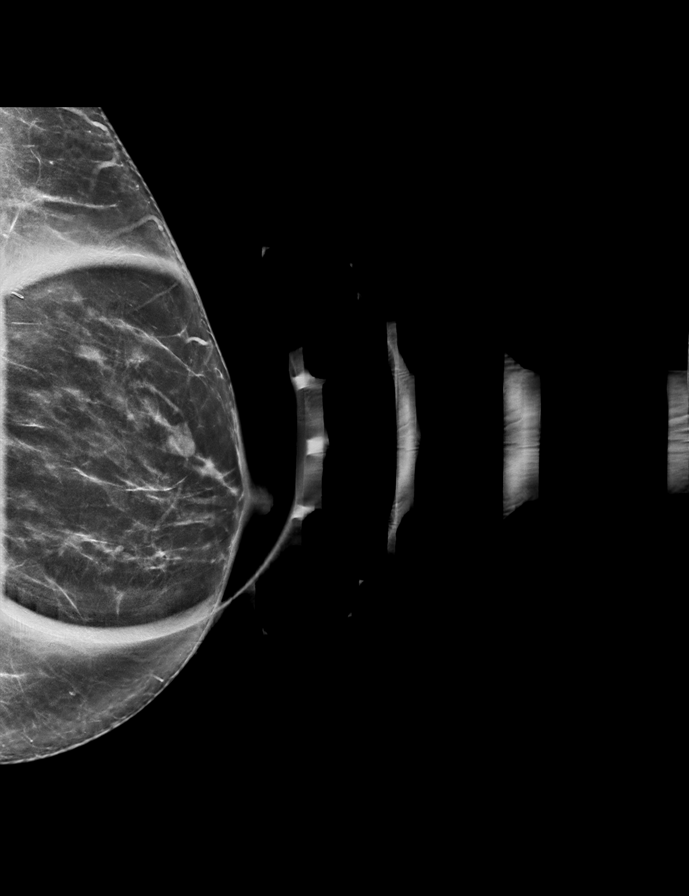

[L CC synth-2D]
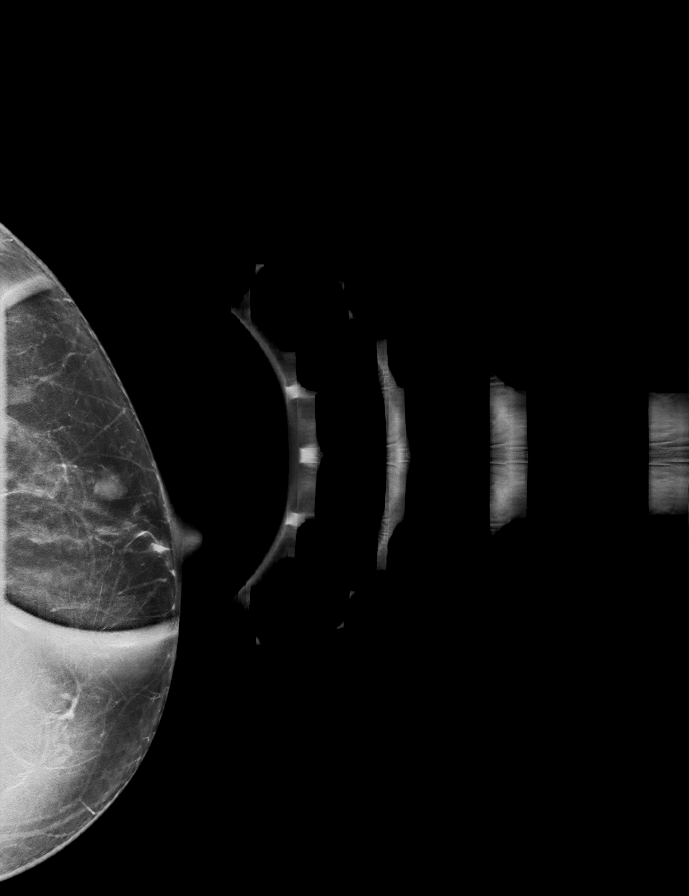

[L MLO tomo · tomo slice 38/75.0]
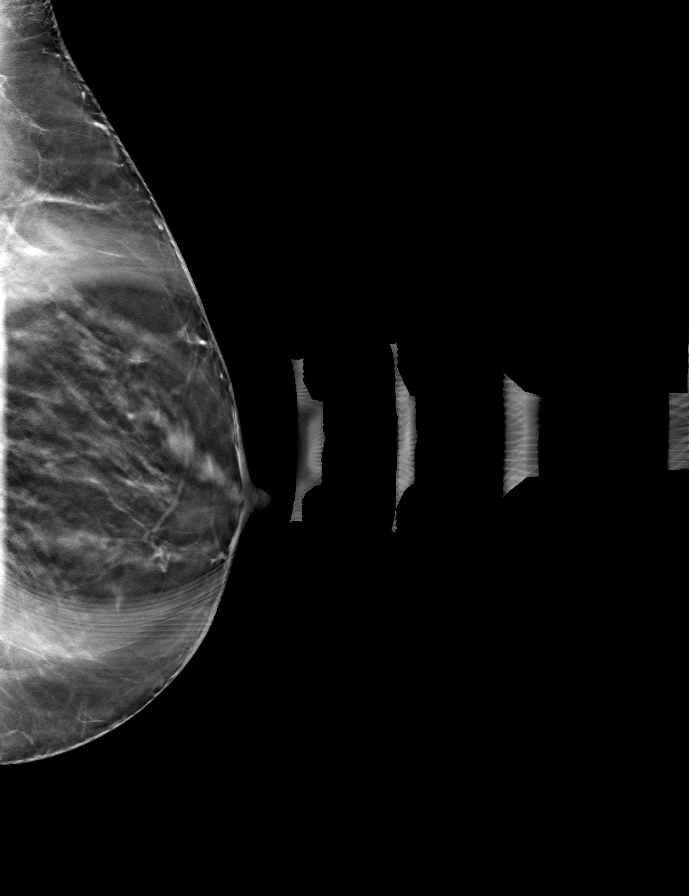

[L CC tomo · tomo slice 31/60.0]
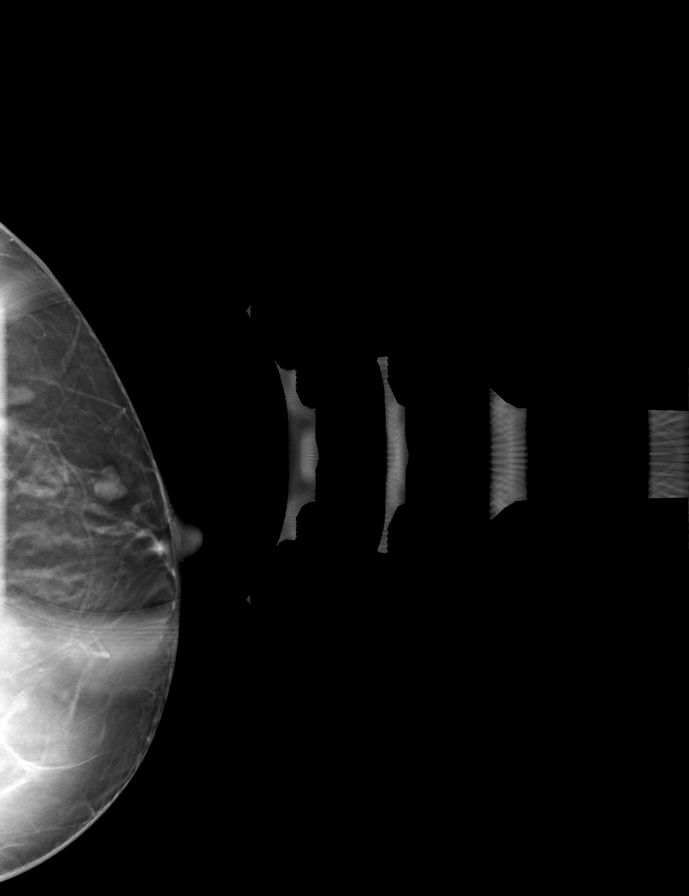

[R CC tomo · tomo slice 46/91.0]
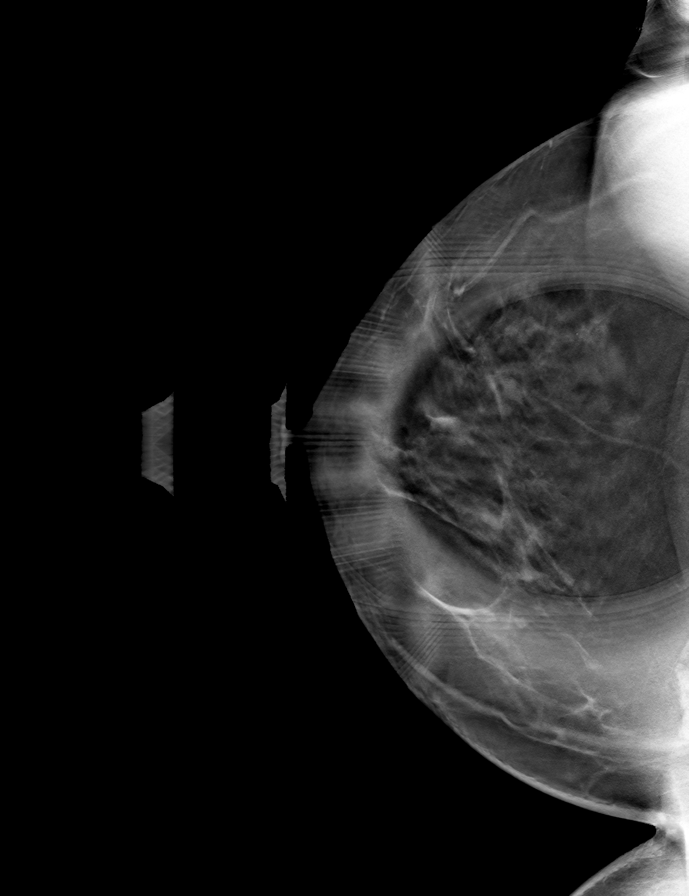

[R MLO tomo · tomo slice 43/85.0]
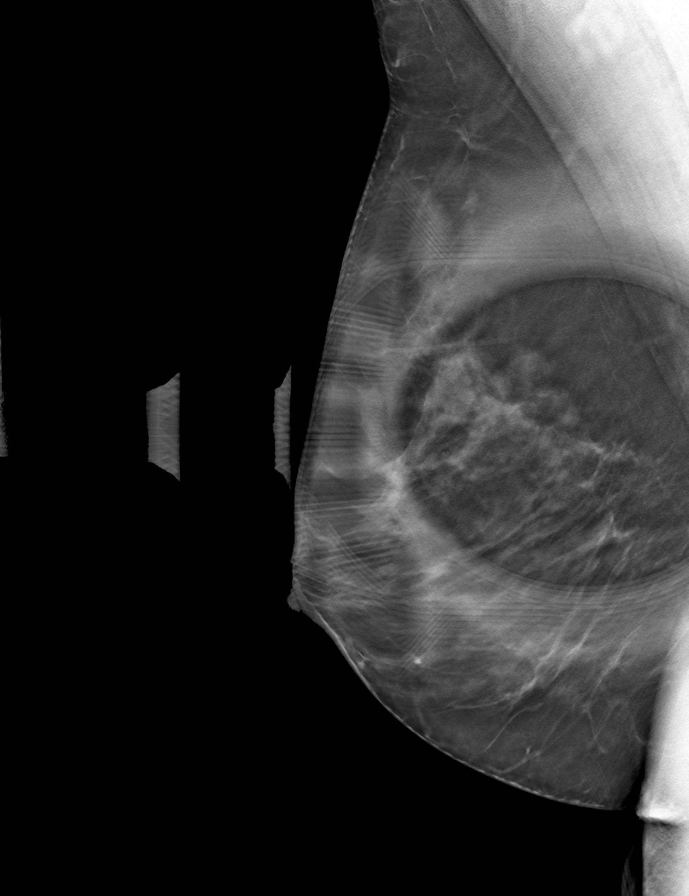

[8 of 24 positions shown; findings below may reference images not displayed]

ACR Breast Density Category c: The breast tissue is heterogeneously
dense, which may obscure small masses.
FINDINGS: There is a persistent oval, partially circumscribed partially
obscured mass in the deep central right breast. There is a
persistent oval, circumscribed equal density mass in the lateral
left breast at anterior depth. Further evaluation with ultrasound
was performed for both areas.

Targeted ultrasound is performed, showing an oval, circumscribed
hypoechoic mass in the deep subareolar location on the right. It
measures 6 x 6 x 3 mm. There is peripheral but no internal
vascularity. This may correspond with the mammographic finding.

Evaluation of the left breast demonstrates an oval, circumscribed
anechoic cyst at the 2 o'clock retroareolar position. It measures 8
x 7 x 6 mm. There is no internal vascularity. This correlates well
with the mammographic finding.
IMPRESSION: 1. Probably benign right breast mass likely corresponding with the
screening mammographic findings. Recommendation is for short-term
imaging follow-up.
2. Benign left breast cyst corresponding with the screening
mammographic findings. No further imaging follow-up required.

RECOMMENDATION:
Diagnostic right breast mammogram and ultrasound in 6 months.

I have discussed the findings and recommendations with the patient.
If applicable, a reminder letter will be sent to the patient
regarding the next appointment.

BI-RADS CATEGORY  3: Probably benign.

## 2022-11-10 ENCOUNTER — Other Ambulatory Visit: Payer: Self-pay | Admitting: Obstetrics and Gynecology

## 2022-11-10 DIAGNOSIS — N6310 Unspecified lump in the right breast, unspecified quadrant: Secondary | ICD-10-CM

## 2022-11-12 ENCOUNTER — Other Ambulatory Visit (HOSPITAL_COMMUNITY): Payer: Self-pay

## 2022-12-08 ENCOUNTER — Other Ambulatory Visit (HOSPITAL_COMMUNITY): Payer: Self-pay

## 2022-12-08 DIAGNOSIS — L812 Freckles: Secondary | ICD-10-CM | POA: Diagnosis not present

## 2022-12-08 DIAGNOSIS — L820 Inflamed seborrheic keratosis: Secondary | ICD-10-CM | POA: Diagnosis not present

## 2022-12-08 DIAGNOSIS — D225 Melanocytic nevi of trunk: Secondary | ICD-10-CM | POA: Diagnosis not present

## 2022-12-08 DIAGNOSIS — D485 Neoplasm of uncertain behavior of skin: Secondary | ICD-10-CM | POA: Diagnosis not present

## 2022-12-08 DIAGNOSIS — D2271 Melanocytic nevi of right lower limb, including hip: Secondary | ICD-10-CM | POA: Diagnosis not present

## 2022-12-08 DIAGNOSIS — D2261 Melanocytic nevi of right upper limb, including shoulder: Secondary | ICD-10-CM | POA: Diagnosis not present

## 2022-12-08 DIAGNOSIS — Z85828 Personal history of other malignant neoplasm of skin: Secondary | ICD-10-CM | POA: Diagnosis not present

## 2022-12-08 DIAGNOSIS — L821 Other seborrheic keratosis: Secondary | ICD-10-CM | POA: Diagnosis not present

## 2022-12-08 DIAGNOSIS — D2262 Melanocytic nevi of left upper limb, including shoulder: Secondary | ICD-10-CM | POA: Diagnosis not present

## 2022-12-08 DIAGNOSIS — B372 Candidiasis of skin and nail: Secondary | ICD-10-CM | POA: Diagnosis not present

## 2022-12-08 MED ORDER — KETOCONAZOLE 2 % EX CREA
1.0000 | TOPICAL_CREAM | Freq: Two times a day (BID) | CUTANEOUS | 2 refills | Status: AC
  Filled 2022-12-08: qty 30, 15d supply, fill #0

## 2022-12-13 ENCOUNTER — Other Ambulatory Visit (HOSPITAL_COMMUNITY): Payer: Self-pay

## 2022-12-20 ENCOUNTER — Other Ambulatory Visit (HOSPITAL_COMMUNITY): Payer: Self-pay

## 2022-12-20 ENCOUNTER — Other Ambulatory Visit: Payer: Self-pay

## 2022-12-28 ENCOUNTER — Other Ambulatory Visit: Payer: Commercial Managed Care - PPO

## 2023-01-04 ENCOUNTER — Ambulatory Visit
Admission: RE | Admit: 2023-01-04 | Discharge: 2023-01-04 | Disposition: A | Discharge status: Home / Self Care | Payer: Commercial Managed Care - PPO | Source: Ambulatory Visit | Attending: Obstetrics and Gynecology | Admitting: Obstetrics and Gynecology

## 2023-01-04 DIAGNOSIS — N6310 Unspecified lump in the right breast, unspecified quadrant: Secondary | ICD-10-CM

## 2023-01-04 DIAGNOSIS — N6315 Unspecified lump in the right breast, overlapping quadrants: Secondary | ICD-10-CM | POA: Diagnosis not present

## 2023-01-04 DIAGNOSIS — Z1231 Encounter for screening mammogram for malignant neoplasm of breast: Secondary | ICD-10-CM | POA: Diagnosis not present

## 2023-01-19 ENCOUNTER — Other Ambulatory Visit (HOSPITAL_COMMUNITY): Payer: Self-pay

## 2023-02-25 ENCOUNTER — Other Ambulatory Visit (HOSPITAL_COMMUNITY): Payer: Self-pay

## 2023-03-04 ENCOUNTER — Other Ambulatory Visit (HOSPITAL_COMMUNITY): Payer: Self-pay

## 2023-03-30 ENCOUNTER — Other Ambulatory Visit (HOSPITAL_COMMUNITY): Payer: Self-pay

## 2023-05-03 ENCOUNTER — Other Ambulatory Visit (HOSPITAL_COMMUNITY): Payer: Self-pay

## 2023-05-25 ENCOUNTER — Other Ambulatory Visit (HOSPITAL_COMMUNITY): Payer: Self-pay

## 2023-05-27 ENCOUNTER — Other Ambulatory Visit: Payer: Self-pay

## 2023-06-03 ENCOUNTER — Other Ambulatory Visit (HOSPITAL_COMMUNITY): Payer: Self-pay

## 2023-06-03 MED ORDER — FLUCONAZOLE 150 MG PO TABS
150.0000 mg | ORAL_TABLET | Freq: Every day | ORAL | 0 refills | Status: DC
  Filled 2023-06-03: qty 2, 2d supply, fill #0

## 2023-06-29 DIAGNOSIS — Z760 Encounter for issue of repeat prescription: Secondary | ICD-10-CM | POA: Diagnosis not present

## 2023-06-29 DIAGNOSIS — Z01419 Encounter for gynecological examination (general) (routine) without abnormal findings: Secondary | ICD-10-CM | POA: Diagnosis not present

## 2023-06-29 DIAGNOSIS — F411 Generalized anxiety disorder: Secondary | ICD-10-CM | POA: Diagnosis not present

## 2023-06-29 DIAGNOSIS — Z6826 Body mass index (BMI) 26.0-26.9, adult: Secondary | ICD-10-CM | POA: Diagnosis not present

## 2023-06-29 DIAGNOSIS — Z124 Encounter for screening for malignant neoplasm of cervix: Secondary | ICD-10-CM | POA: Diagnosis not present

## 2023-06-30 ENCOUNTER — Other Ambulatory Visit (HOSPITAL_COMMUNITY): Payer: Self-pay

## 2023-06-30 ENCOUNTER — Other Ambulatory Visit: Payer: Self-pay

## 2023-06-30 MED ORDER — ESCITALOPRAM OXALATE 10 MG PO TABS
10.0000 mg | ORAL_TABLET | Freq: Every day | ORAL | 12 refills | Status: DC
  Filled 2023-06-30: qty 30, 30d supply, fill #0
  Filled 2023-08-08: qty 30, 30d supply, fill #1
  Filled 2023-09-30: qty 30, 30d supply, fill #2
  Filled 2023-11-10: qty 30, 30d supply, fill #3
  Filled 2023-12-14: qty 30, 30d supply, fill #4
  Filled 2024-01-27: qty 30, 30d supply, fill #5
  Filled 2024-03-01: qty 30, 30d supply, fill #6
  Filled 2024-04-25: qty 30, 30d supply, fill #7
  Filled 2024-06-04: qty 30, 30d supply, fill #8

## 2023-06-30 MED ORDER — LO LOESTRIN FE 1 MG-10 MCG / 10 MCG PO TABS
1.0000 | ORAL_TABLET | Freq: Every day | ORAL | 12 refills | Status: DC
  Filled 2023-06-30 – 2023-08-15 (×2): qty 84, 84d supply, fill #0
  Filled 2023-11-22: qty 84, 84d supply, fill #1
  Filled 2024-01-27 – 2024-01-31 (×2): qty 84, 84d supply, fill #2
  Filled 2024-04-30: qty 84, 84d supply, fill #3

## 2023-07-05 DIAGNOSIS — Z1321 Encounter for screening for nutritional disorder: Secondary | ICD-10-CM | POA: Diagnosis not present

## 2023-07-05 DIAGNOSIS — Z130 Encounter for screening for diseases of the blood and blood-forming organs and certain disorders involving the immune mechanism: Secondary | ICD-10-CM | POA: Diagnosis not present

## 2023-07-05 DIAGNOSIS — Z13228 Encounter for screening for other metabolic disorders: Secondary | ICD-10-CM | POA: Diagnosis not present

## 2023-07-05 DIAGNOSIS — Z131 Encounter for screening for diabetes mellitus: Secondary | ICD-10-CM | POA: Diagnosis not present

## 2023-07-05 DIAGNOSIS — Z1329 Encounter for screening for other suspected endocrine disorder: Secondary | ICD-10-CM | POA: Diagnosis not present

## 2023-08-08 ENCOUNTER — Other Ambulatory Visit: Payer: Self-pay

## 2023-08-08 ENCOUNTER — Ambulatory Visit: Payer: Commercial Managed Care - PPO | Admitting: Sports Medicine

## 2023-08-08 ENCOUNTER — Other Ambulatory Visit (HOSPITAL_COMMUNITY): Payer: Self-pay

## 2023-08-08 DIAGNOSIS — M533 Sacrococcygeal disorders, not elsewhere classified: Secondary | ICD-10-CM

## 2023-08-08 DIAGNOSIS — M25551 Pain in right hip: Secondary | ICD-10-CM

## 2023-08-08 DIAGNOSIS — Q6589 Other specified congenital deformities of hip: Secondary | ICD-10-CM

## 2023-08-08 DIAGNOSIS — G8929 Other chronic pain: Secondary | ICD-10-CM

## 2023-08-08 DIAGNOSIS — M545 Low back pain, unspecified: Secondary | ICD-10-CM

## 2023-08-08 MED ORDER — DICLOFENAC SODIUM 50 MG PO TBEC
50.0000 mg | DELAYED_RELEASE_TABLET | Freq: Two times a day (BID) | ORAL | 0 refills | Status: AC
Start: 2023-08-08 — End: 2023-09-07
  Filled 2023-08-08: qty 60, 30d supply, fill #0

## 2023-08-08 NOTE — Progress Notes (Signed)
Cathy Hickman - 52 y.o. female MRN [id number]  Date of birth: 1971/02/05  Office Visit Note: Visit Date: 08/08/2023 PCP: Eartha Inch, MD Referred by: Eartha Inch, MD  Subjective: Chief Complaint  Patient presents with   Back Pain   HPI: Cathy Hickman is a pleasant 52 y.o. female who presents today for right-sided low back pain that is acute on chronic. She is one of our best workers here at [employer].  Cathy Hickman has had issues with her back in the past, but here over the last month or so her pain has worsened and been quite significant the past week or two. Her pain is worse with sitting, going from sitting to standing, and with bending forward/extending up. Feels locked up, she has been walking differently because of the pain. She denies any radicular symptoms; no numbness/tingling or pain shooting down the legs.   She has always had some mild restriction with the right hip. Used to sit in a "W" formation all the time as a child/young adult. Has been told her R > L leg inverts and walks "pigeon-toed."  Pertinent ROS were reviewed with the patient and found to be negative unless otherwise specified above in HPI.   Assessment & Plan: Visit Diagnoses:  1. Chronic right SI joint pain   2. Chronic right-sided low back pain without sciatica   3. Femoral anteversion of right lower extremity    Plan: Discussed with Kim the nature of her acute on chronic right sided low back pain which seems to emanate from the right SI joint.  She has no significant degenerative change about the joint, and I think her pathology comes more so from the connective tissue and overlying cross musculature surrounding the SI joint.  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  We will start her on a 2-week course of Voltaren  50 mg twice daily followed by 50 mg daily for 1 additional week.  We did provide her a handout and did review SI joint stretches and rehab exercises she will perform once daily at home.  Would recommend heat applied to the low back as well.  We will see how she is feeling over the next 3 weeks or so.  Additional treatment considerations could include formalized PT, ultrasound-guided SI joint injection if she is not finding benefit with above.  Follow-up: as needed   Meds & Orders:  Meds ordered this encounter  Medications   diclofenac (VOLTAREN) 50 MG EC tablet    Sig: Take 1 tablet (50 mg total) by mouth 2 (two) times daily. Twice daily scheduled x 2 weeks (may take 3rd dose only if needed), then wean to once daily x 1 week    Dispense:  60 tablet    Refill:  0    Orders Placed This Encounter  Procedures   XR Lumbar Spine 2-3 Views   XR HIP UNILAT W OR W/O PELVIS 2-3 VIEWS RIGHT     Procedures: No procedures performed      Clinical History: No specialty comments available.  She reports that she has never smoked. She has never used smokeless tobacco. No results for input(s): "HGBA1C", "LABURIC" in the last 8760 hours.  Objective:   Vital Signs: LMP 03/13/2014   Physical Exam  Gen: Well-appearing, in no acute distress; non-toxic  CV:  Well-perfused. Warm.  Resp: Breathing unlabored on room air; no wheezing. Psych: Fluid speech in conversation; appropriate affect; normal thought process Neuro: Sensation intact throughout. No gross coordination deficits.   Ortho Exam - Lumbar: No midline SP tenderness. There is + TTP over the Right SI joint just distal to the PSIS. + Fortin's point test and Gaenslen's test. FROM in flexion and extension, although pain worsens with extension. 5/5 strength of b/l LE's. Negative straight leg raise b/l.  - Hips: No GT or bony TTP. Mild degree of femoral anteversion R > L. FROM with internal/external logroll. No pain with FADIR, + equivocal FABERE  testing posteriorly.   Imaging:  - Lumbar 2 view XR 08/08/23: 2 views of the lumbar spine including AP and lateral femoral ordered and reviewed by myself.  There are 5 nonrib-bearing lumbar vertebrae.  No scoliosis.  There is at mild DDD with disc space narrowing at the L4-L5 level and mild right SI joint narrowing.  No other significant degenerative change or acute bony abnormality.  - Right hip 2 view XR 08/08/23: 2 views of the right hip including AP and lateral film were ordered and reviewed by myself.  X-rays demonstrate a femoral head well-seated within the acetabulum.  There is a degree of femoral anteversion right > left femur.  There is preserved joint space within each hip joint without any significant arthritic change or acute bony abnormality.  Past Medical/Family/Surgical/Social History: Medications & Allergies reviewed per EMR, new medications updated. There are no problems to display for this patient.  Past Medical History:  Diagnosis Date   Allergy    Anemia    slight with pregnancy   GERD (gastroesophageal reflux disease)    Uterine polyp    Family History  Problem Relation Age of Onset   Hypertension Mother    Colon polyps Mother    Hypertension Brother    Non-Hodgkin's lymphoma Maternal Grandmother    Diabetes Paternal Grandmother    Colon cancer Neg Hx    Esophageal cancer Neg Hx    Pancreatic cancer Neg Hx    Liver disease Neg Hx    Stomach cancer Neg Hx    Rectal cancer Neg Hx    Past Surgical History:  Procedure Laterality Date   COLONOSCOPY     as teenager   DILATATION AND CURETTAGE/HYSTEROSCOPY WITH MINERVA     TONSILLECTOMY     age 79   UPPER GASTROINTESTINAL ENDOSCOPY     as teenager   Social History   Occupational History   Not on file  Tobacco Use   Smoking status: Never   Smokeless tobacco: Never  Vaping Use   Vaping status: Never Used  Substance and Sexual Activity   Alcohol use: Yes    Comment: occ   Drug use: Never   Sexual  activity: Not Currently

## 2023-08-09 ENCOUNTER — Encounter: Payer: Self-pay | Admitting: Sports Medicine

## 2023-08-15 ENCOUNTER — Other Ambulatory Visit (HOSPITAL_COMMUNITY): Payer: Self-pay

## 2023-09-13 ENCOUNTER — Other Ambulatory Visit: Payer: Self-pay | Admitting: Physical Medicine and Rehabilitation

## 2023-09-13 MED ORDER — FLUCONAZOLE 150 MG PO TABS: 150.0000 mg | ORAL_TABLET | ORAL | 0 refills | Status: DC

## 2023-09-30 ENCOUNTER — Other Ambulatory Visit (HOSPITAL_COMMUNITY): Payer: Self-pay

## 2023-10-03 ENCOUNTER — Other Ambulatory Visit (HOSPITAL_COMMUNITY): Payer: Self-pay

## 2023-10-03 MED ORDER — TRETINOIN 0.025 % EX CREA
1.0000 | TOPICAL_CREAM | Freq: Every day | CUTANEOUS | 2 refills | Status: AC
  Filled 2023-10-03 – 2023-11-10 (×2): qty 20, 30d supply, fill #0
  Filled 2024-04-25: qty 20, 30d supply, fill #1
  Filled 2024-08-15: qty 20, 30d supply, fill #2

## 2023-10-13 ENCOUNTER — Other Ambulatory Visit (HOSPITAL_COMMUNITY): Payer: Self-pay

## 2023-11-10 ENCOUNTER — Other Ambulatory Visit (HOSPITAL_COMMUNITY): Payer: Self-pay

## 2023-11-22 ENCOUNTER — Other Ambulatory Visit (HOSPITAL_COMMUNITY): Payer: Self-pay

## 2023-12-14 ENCOUNTER — Other Ambulatory Visit (HOSPITAL_COMMUNITY): Payer: Self-pay

## 2023-12-21 DIAGNOSIS — L812 Freckles: Secondary | ICD-10-CM | POA: Diagnosis not present

## 2023-12-21 DIAGNOSIS — D2372 Other benign neoplasm of skin of left lower limb, including hip: Secondary | ICD-10-CM | POA: Diagnosis not present

## 2023-12-21 DIAGNOSIS — L821 Other seborrheic keratosis: Secondary | ICD-10-CM | POA: Diagnosis not present

## 2023-12-21 DIAGNOSIS — D2272 Melanocytic nevi of left lower limb, including hip: Secondary | ICD-10-CM | POA: Diagnosis not present

## 2023-12-21 DIAGNOSIS — D225 Melanocytic nevi of trunk: Secondary | ICD-10-CM | POA: Diagnosis not present

## 2023-12-21 DIAGNOSIS — D2261 Melanocytic nevi of right upper limb, including shoulder: Secondary | ICD-10-CM | POA: Diagnosis not present

## 2023-12-21 DIAGNOSIS — Z85828 Personal history of other malignant neoplasm of skin: Secondary | ICD-10-CM | POA: Diagnosis not present

## 2023-12-21 DIAGNOSIS — D2262 Melanocytic nevi of left upper limb, including shoulder: Secondary | ICD-10-CM | POA: Diagnosis not present

## 2023-12-21 DIAGNOSIS — D2271 Melanocytic nevi of right lower limb, including hip: Secondary | ICD-10-CM | POA: Diagnosis not present

## 2023-12-21 DIAGNOSIS — D1801 Hemangioma of skin and subcutaneous tissue: Secondary | ICD-10-CM | POA: Diagnosis not present

## 2024-01-27 ENCOUNTER — Other Ambulatory Visit (HOSPITAL_COMMUNITY): Payer: Self-pay

## 2024-01-31 ENCOUNTER — Other Ambulatory Visit (HOSPITAL_COMMUNITY): Payer: Self-pay

## 2024-03-01 ENCOUNTER — Other Ambulatory Visit (HOSPITAL_COMMUNITY): Payer: Self-pay

## 2024-03-21 ENCOUNTER — Other Ambulatory Visit: Payer: Self-pay | Admitting: Obstetrics and Gynecology

## 2024-03-21 DIAGNOSIS — Z1231 Encounter for screening mammogram for malignant neoplasm of breast: Secondary | ICD-10-CM

## 2024-03-23 ENCOUNTER — Ambulatory Visit
Admission: RE | Admit: 2024-03-23 | Discharge: 2024-03-23 | Disposition: A | Discharge status: Home / Self Care | Source: Ambulatory Visit | Attending: Obstetrics and Gynecology | Admitting: Obstetrics and Gynecology

## 2024-03-23 DIAGNOSIS — Z1231 Encounter for screening mammogram for malignant neoplasm of breast: Secondary | ICD-10-CM | POA: Diagnosis not present

## 2024-04-25 ENCOUNTER — Other Ambulatory Visit (HOSPITAL_COMMUNITY): Payer: Self-pay

## 2024-04-25 DIAGNOSIS — H524 Presbyopia: Secondary | ICD-10-CM | POA: Diagnosis not present

## 2024-04-25 MED ORDER — XIIDRA 5 % OP SOLN
1.0000 [drp] | Freq: Two times a day (BID) | OPHTHALMIC | 3 refills | Status: DC
  Filled 2024-04-25: qty 180, 90d supply, fill #0

## 2024-04-30 ENCOUNTER — Other Ambulatory Visit (HOSPITAL_COMMUNITY): Payer: Self-pay

## 2024-05-08 ENCOUNTER — Other Ambulatory Visit (HOSPITAL_COMMUNITY): Payer: Self-pay

## 2024-06-04 ENCOUNTER — Other Ambulatory Visit (HOSPITAL_COMMUNITY): Payer: Self-pay

## 2024-06-11 ENCOUNTER — Ambulatory Visit (AMBULATORY_SURGERY_CENTER)

## 2024-06-11 ENCOUNTER — Other Ambulatory Visit (HOSPITAL_COMMUNITY): Payer: Self-pay

## 2024-06-11 ENCOUNTER — Encounter: Payer: Self-pay | Admitting: Internal Medicine

## 2024-06-11 VITALS — Ht 64.0 in | Wt 155.0 lb

## 2024-06-11 DIAGNOSIS — Z860100 Personal history of colon polyps, unspecified: Secondary | ICD-10-CM

## 2024-06-11 MED ORDER — NA SULFATE-K SULFATE-MG SULF 17.5-3.13-1.6 GM/177ML PO SOLN
1.0000 | Freq: Once | ORAL | 0 refills | Status: AC
  Filled 2024-06-11: qty 354, 1d supply, fill #0

## 2024-06-11 NOTE — Progress Notes (Signed)
 Pre visit completed via phone call; Patient verified name, DOB, and address; No egg or soy allergy known to patient;  No issues known to pt with past sedation with any surgeries or procedures; Patient denies ever being told they had issues or difficulty with intubation;  No FH of Malignant Hyperthermia; Pt is not on diet pills; Pt is not on home 02;  Pt is not on blood thinners;  Pt reports issues with constipation-has issues at times to diet; patient advised to increase oral fluids/fruits/veggies, activity prior to procedure; No A fib or A flutter; Have any cardiac testing pending--NO Insurance verified during PV appt--- [employer] Pt can ambulate without assistance;  Pt denies use of chewing tobacco Discussed diabetic/weight loss medication holds; Discussed NSAID holds; Checked BMI to be less than 50; Pt instructed to use [employer] or GoodRx for a price reduction on prep  Patient's chart reviewed by Norleen Schillings CNRA prior to previsit and patient appropriate for the [facility].  Pre visit completed and red dot placed by patient's name on their procedure day (on provider's schedule).   Instructions sent to MyChart per patient request;

## 2024-06-22 ENCOUNTER — Telehealth: Payer: Self-pay | Admitting: Internal Medicine

## 2024-06-22 NOTE — Telephone Encounter (Signed)
 Inbound call from patient stating she would like to speak to nurse in regards to prep instructions for prep medication. Procedure on 06/25/24 Requesting a call back Please advise  Thank you

## 2024-06-22 NOTE — Telephone Encounter (Signed)
 Reviewed prep instructions with patient and answered all questions.  Patient verbally understood all instructions for colon prep on 06/25/24.

## 2024-06-25 ENCOUNTER — Ambulatory Visit: Admitting: Internal Medicine

## 2024-06-25 ENCOUNTER — Encounter: Payer: Self-pay | Admitting: Internal Medicine

## 2024-06-25 VITALS — BP 132/75 | HR 63 | Temp 98.2°F | Resp 15 | Ht 64.0 in | Wt 155.0 lb

## 2024-06-25 DIAGNOSIS — D122 Benign neoplasm of ascending colon: Secondary | ICD-10-CM

## 2024-06-25 DIAGNOSIS — Z860101 Personal history of adenomatous and serrated colon polyps: Secondary | ICD-10-CM | POA: Diagnosis not present

## 2024-06-25 DIAGNOSIS — Z860100 Personal history of colon polyps, unspecified: Secondary | ICD-10-CM

## 2024-06-25 DIAGNOSIS — K5730 Diverticulosis of large intestine without perforation or abscess without bleeding: Secondary | ICD-10-CM | POA: Diagnosis not present

## 2024-06-25 DIAGNOSIS — Z1211 Encounter for screening for malignant neoplasm of colon: Secondary | ICD-10-CM | POA: Diagnosis not present

## 2024-06-25 DIAGNOSIS — K635 Polyp of colon: Secondary | ICD-10-CM | POA: Diagnosis not present

## 2024-06-25 MED ORDER — SODIUM CHLORIDE 0.9 % IV SOLN
500.0000 mL | Freq: Once | INTRAVENOUS | Status: DC
Start: 2024-06-25 — End: 2024-06-25

## 2024-06-25 NOTE — Patient Instructions (Addendum)
Resume previous diet. Continue present medications. Await pathology results. Repeat colonoscopy in 5 years for surveillance.  YOU HAD AN ENDOSCOPIC PROCEDURE TODAY AT THE [facility]:   Refer to the procedure report that was given to you for any specific questions about what was found during the examination.  If the procedure report does not answer your questions, please call your gastroenterologist to clarify.  If you requested that your care partner not be given the details of your procedure findings, then the procedure report has been included in a sealed envelope for you to review at your convenience later.  YOU SHOULD EXPECT: Some feelings of bloating in the abdomen. Passage of more gas than usual.  Walking can help get rid of the air that was put into your GI tract during the procedure and reduce the bloating. If you had a lower endoscopy (such as a colonoscopy or flexible sigmoidoscopy) you may notice spotting of blood in your stool or on the toilet paper. If you underwent a bowel prep for your procedure, you may not have a normal bowel movement for a few days.  Please Note:  You might notice some irritation and congestion in your nose or some drainage.  This is from the oxygen used during your procedure.  There is no need for concern and it should clear up in a day or so.  SYMPTOMS TO REPORT IMMEDIATELY:  Following lower endoscopy (colonoscopy or flexible sigmoidoscopy):  Excessive amounts of blood in the stool  Significant tenderness or worsening of abdominal pains  Swelling of the abdomen that is new, acute  Fever of 100F or higher   For urgent or emergent issues, a gastroenterologist can be reached at any hour by calling [phone]. Do not use MyChart messaging for urgent concerns.    DIET:  We do recommend a small meal at first, but then you may proceed to your regular diet.  Drink plenty of fluids but you should avoid alcoholic beverages for 24  hours.  ACTIVITY:  You should plan to take it easy for the rest of today and you should NOT DRIVE or use heavy machinery until tomorrow (because of the sedation medicines used during the test).    FOLLOW UP: Our staff will call the number listed on your records the next business day following your procedure.  We will call around 7:15- 8:00 am to check on you and address any questions or concerns that you may have regarding the information given to you following your procedure. If we do not reach you, we will leave a message.     If any biopsies were taken you will be contacted by phone or by letter within the next 1-3 weeks.  Please call us at [phone] if you have not heard about the biopsies in 3 weeks.    SIGNATURES/CONFIDENTIALITY: You and/or your care partner have signed paperwork which will be entered into your electronic medical record.  These signatures attest to the fact that that the information above on your After Visit Summary has been reviewed and is understood.  Full responsibility of the confidentiality of this discharge information lies with you and/or your care-partner.

## 2024-06-25 NOTE — Op Note (Signed)
 [facility] Patient Name: Cathy Hickman Procedure Date: 06/25/2024 9:47 AM MRN: [id number] Endoscopist: Norleen SAILOR. Abran , MD, [id number] Age: 53 Referring MD:  Date of Birth: 1971-02-28 Gender: Female Account #: [id number] Procedure:                Colonoscopy with cold snare polypectomy x 1 Indications:              High risk colon cancer surveillance: Personal                            history of adenoma (10 mm or greater in size) Medicines:                Monitored Anesthesia Care Procedure:                Pre-Anesthesia Assessment:                           - Prior to the procedure, a History and Physical                            was performed, and patient medications and                            allergies were reviewed. The patient's tolerance of                            previous anesthesia was also reviewed. The risks                            and benefits of the procedure and the sedation                            options and risks were discussed with the patient.                            All questions were answered, and informed consent                            was obtained. Prior Anticoagulants: The patient has                            taken no anticoagulant or antiplatelet agents. ASA                            Grade Assessment: II - A patient with mild systemic                            disease. After reviewing the risks and benefits,                            the patient was deemed in satisfactory condition to                            undergo the procedure.  After obtaining informed consent, the colonoscope                            was passed under direct vision. Throughout the                            procedure, the patient's blood pressure, pulse, and                            oxygen saturations were monitored continuously. The                            Olympus Scope SN: [id number] was introduced through                             the anus and advanced to the the cecum, identified                            by appendiceal orifice and ileocecal valve. The                            ileocecal valve, appendiceal orifice, and rectum                            were photographed. The quality of the bowel                            preparation was excellent. The colonoscopy was                            performed without difficulty. The patient tolerated                            the procedure well. The bowel preparation used was                            SUPREP via split dose instruction. Scope In: 9:58:43 AM Scope Out: 10:10:32 AM Scope Withdrawal Time: 0 hours 9 minutes 25 seconds  Total Procedure Duration: 0 hours 11 minutes 49 seconds  Findings:                 A 1 mm polyp was found in the ascending colon. The                            polyp was removed with a cold snare. Resection and                            retrieval were complete.                           Many diverticula were found in the entire colon.                           The exam was otherwise without abnormality  on                            direct and retroflexion views. Complications:            No immediate complications. Estimated blood loss:                            None. Estimated Blood Loss:     Estimated blood loss: none. Impression:               - One 1 mm polyp in the ascending colon, removed                            with a cold snare. Resected and retrieved.                           - Diverticulosis in the entire examined colon.                           - The examination was otherwise normal on direct                            and retroflexion views. Recommendation:           - Repeat colonoscopy in 5 years for surveillance.                           - Patient has a contact number available for                            emergencies. The signs and symptoms of potential                            delayed  complications were discussed with the                            patient. Return to normal activities tomorrow.                            Written discharge instructions were provided to the                            patient.                           - Resume previous diet.                           - Continue present medications.                           - Await pathology results. Norleen SAILOR. Abran, MD 06/25/2024 10:14:44 AM This report has been signed electronically.

## 2024-06-25 NOTE — Progress Notes (Signed)
 Pt's states no medical or surgical changes since previsit or office visit.

## 2024-06-25 NOTE — Progress Notes (Signed)
 Transferred to PACU via stretcher, arousing, VSS.

## 2024-06-25 NOTE — Progress Notes (Signed)
 Called to room to assist during endoscopic procedure.  Patient ID and intended procedure confirmed with present staff. Received instructions for my participation in the procedure from the performing physician.

## 2024-06-25 NOTE — Progress Notes (Signed)
 HISTORY OF PRESENT ILLNESS:  Cathy Hickman is a 53 y.o. female with history of advanced adenomatous colon polyp.  Presents for surveillance colonoscopy  REVIEW OF SYSTEMS:  All non-GI ROS negative except for  Past Medical History:  Diagnosis Date   Allergy    Anemia    slight with pregnancy   GERD (gastroesophageal reflux disease)    Uterine polyp     Past Surgical History:  Procedure Laterality Date   COLONOSCOPY     as teenager   COLONOSCOPY  06/22/2021   [facility] -recall 3 years/TA   DILATATION AND CURETTAGE/HYSTEROSCOPY WITH MINERVA     TONSILLECTOMY     age 76   UPPER GASTROINTESTINAL ENDOSCOPY     as teenager    Social History Cathy Hickman  reports that she has never smoked. She has never used smokeless tobacco. She reports current alcohol use of about 1.0 standard drink of alcohol per week. She reports that she does not use drugs.  family history includes Colon polyps (age of onset: 65) in her mother; Diabetes in her paternal grandmother; Hypertension in her brother and mother; Non-Hodgkin's lymphoma in her maternal grandmother.  No Known Allergies     PHYSICAL EXAMINATION: Vital signs: BP (!) 144/78   Pulse 62   Temp 98.2 F (36.8 C) (Temporal)   Ht 5' 4 (1.626 m)   Wt 155 lb (70.3 kg)   LMP 03/13/2014   SpO2 98%   BMI 26.61 kg/m  General: Well-developed, well-nourished, no acute distress HEENT: Sclerae are anicteric, conjunctiva pink. Oral mucosa intact Lungs: Clear Heart: Regular Abdomen: soft, nontender, nondistended, no obvious ascites, no peritoneal signs, normal bowel sounds. No organomegaly. Extremities: No edema Psychiatric: alert and oriented x3. Cooperative     ASSESSMENT:  History of advanced adenoma   PLAN:   Surveillance colonoscopy

## 2024-06-26 ENCOUNTER — Telehealth: Payer: Self-pay | Admitting: *Deleted

## 2024-06-26 NOTE — Telephone Encounter (Signed)
 Attempted f/u phone call. No answer. Left message.

## 2024-06-27 ENCOUNTER — Ambulatory Visit: Payer: Self-pay | Admitting: Internal Medicine

## 2024-06-27 LAB — SURGICAL PATHOLOGY

## 2024-07-09 ENCOUNTER — Encounter: Payer: Self-pay | Admitting: Radiology

## 2024-07-10 ENCOUNTER — Other Ambulatory Visit (HOSPITAL_COMMUNITY): Payer: Self-pay

## 2024-07-10 DIAGNOSIS — F411 Generalized anxiety disorder: Secondary | ICD-10-CM | POA: Diagnosis not present

## 2024-07-10 DIAGNOSIS — Z760 Encounter for issue of repeat prescription: Secondary | ICD-10-CM | POA: Diagnosis not present

## 2024-07-10 DIAGNOSIS — Z124 Encounter for screening for malignant neoplasm of cervix: Secondary | ICD-10-CM | POA: Diagnosis not present

## 2024-07-10 DIAGNOSIS — Z6829 Body mass index (BMI) 29.0-29.9, adult: Secondary | ICD-10-CM | POA: Diagnosis not present

## 2024-07-10 DIAGNOSIS — R319 Hematuria, unspecified: Secondary | ICD-10-CM | POA: Diagnosis not present

## 2024-07-10 DIAGNOSIS — Z01419 Encounter for gynecological examination (general) (routine) without abnormal findings: Secondary | ICD-10-CM | POA: Diagnosis not present

## 2024-07-10 MED ORDER — BUPROPION HCL ER (XL) 150 MG PO TB24
150.0000 mg | ORAL_TABLET | ORAL | 0 refills | Status: AC
  Filled 2024-07-10: qty 14, 14d supply, fill #0

## 2024-07-10 MED ORDER — LO LOESTRIN FE 1 MG-10 MCG / 10 MCG PO TABS
1.0000 | ORAL_TABLET | Freq: Every day | ORAL | 12 refills | Status: AC
  Filled 2024-07-10: qty 28, 28d supply, fill #0
  Filled 2024-08-15: qty 28, 28d supply, fill #1
  Filled 2024-09-17: qty 28, 28d supply, fill #2
  Filled 2024-10-12: qty 28, 28d supply, fill #3
  Filled 2024-10-12: qty 84, 84d supply, fill #0

## 2024-07-10 MED ORDER — ESCITALOPRAM OXALATE 10 MG PO TABS
10.0000 mg | ORAL_TABLET | Freq: Every day | ORAL | 12 refills | Status: AC
  Filled 2024-07-10: qty 90, 90d supply, fill #0
  Filled 2024-10-12: qty 90, 90d supply, fill #1
  Filled 2024-10-12: qty 90, 90d supply, fill #0

## 2024-07-10 MED ORDER — BUPROPION HCL ER (XL) 300 MG PO TB24
300.0000 mg | ORAL_TABLET | ORAL | 6 refills | Status: AC
  Filled 2024-07-10: qty 30, 30d supply, fill #0
  Filled 2024-08-15: qty 30, 30d supply, fill #1
  Filled 2024-09-17: qty 30, 30d supply, fill #2
  Filled 2024-10-12: qty 90, 90d supply, fill #0
  Filled 2024-10-12: qty 30, 30d supply, fill #3

## 2024-07-16 DIAGNOSIS — Z130 Encounter for screening for diseases of the blood and blood-forming organs and certain disorders involving the immune mechanism: Secondary | ICD-10-CM | POA: Diagnosis not present

## 2024-07-16 DIAGNOSIS — Z1329 Encounter for screening for other suspected endocrine disorder: Secondary | ICD-10-CM | POA: Diagnosis not present

## 2024-07-16 DIAGNOSIS — Z13228 Encounter for screening for other metabolic disorders: Secondary | ICD-10-CM | POA: Diagnosis not present

## 2024-07-16 DIAGNOSIS — Z1321 Encounter for screening for nutritional disorder: Secondary | ICD-10-CM | POA: Diagnosis not present

## 2024-07-16 DIAGNOSIS — Z131 Encounter for screening for diabetes mellitus: Secondary | ICD-10-CM | POA: Diagnosis not present

## 2024-09-18 ENCOUNTER — Other Ambulatory Visit (HOSPITAL_COMMUNITY): Payer: Self-pay

## 2024-10-12 ENCOUNTER — Other Ambulatory Visit (HOSPITAL_COMMUNITY): Payer: Self-pay
